# Patient Record
Sex: Male | Born: 1990
Health system: Western US, Academic
[De-identification: ages and names within clinical notes are randomized; demographics above are authoritative.]

---

## 2021-05-25 ENCOUNTER — Telehealth: Payer: TRICARE (CHAMPUS)

## 2021-05-25 NOTE — Telephone Encounter
PDL Call to Clinic    Reason for Call: Per patient, they are requesting to verify if their fax was received. Bree advised we have not received a fax. Patient stated they will fax it again.     Appointment Related?  []  Yes  [x]  No     If yes;  Date: tbd  Time: tbd    Call warm transferred to PDL: []  Yes  [x]  No    Call Received by Clinic Representative: Bree    If call not answered/not accepted, call received by Patient Services Representative:

## 2021-05-26 NOTE — Telephone Encounter
Reply by: Foye Deer  Our office has not received a fax yet.

## 2021-06-01 ENCOUNTER — Ambulatory Visit: Payer: TRICARE (CHAMPUS)

## 2021-06-01 DIAGNOSIS — M25559 Pain in unspecified hip: Secondary | ICD-10-CM

## 2021-06-03 NOTE — Progress Notes
Berenise Hunton Rodman Key, MD  [facility] [place]  Division of Dermatology      Patient Name: Zachary Parsons  MRN: [id number]  Date of Birth: 1990-10-07  Today's Date: 06/03/2021  Last Visit: Visit date not found    Referring Provider: Referral, Self  Primary Care Provider: Talmage Nap, FNP    Subjective:   History:  Patient is a 30 y.o.-year-old male here for poliosis. New patient.     Location: right face- forehead, eyelid, poliosis aNd right cheek  Duration: First noticed patches 1 year ago. Got worse in January 2022  Symptoms: None  Treatments tried: As below    He says he got covid vaccine in August and then noticed these new patches in October. 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 He saw a dermatologist this last year in [place] and clinically c/w vitligo. Was started on Tacrolimus and excimer.Still using tacrolimus twice daily. Tried Excimer laser for about 1 month.  Stopped excimer because he got burned. Has not noticed much of a different with the tacrolimus. He notices that since January right eye has been twitching but denies any change in vision or hearing. Has not seen an ophthalmologist.     PMH: None  Meds: started on Lexapro in June 2022     Denies a personal or family history of skin cancer.    ROS: Otherwise feeling well. No other skin complaints.     Past Medical History:  No past medical history on file.    Social History:       Outpatient Medications:  No current outpatient medications on file.     No current facility-administered medications for this visit.       Allergies:  Not on File    Objective/ Assessment/ Plan:     Physical Examination:  Patient was offered a chaperone, but declined.     General: Pleasant, in no acute distress, appears stated age  Psychiatric: Mood and affect are within normal limits    Skin type: III    Limited exam of the face today  Pertinent skin findings as below:   - Depigmented patches on the right face (forehead, upper eyelid and right cheek)  - Poliosis of the right upper eyelashes. 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 Now stabilized per patient  Counseling/ Work-up:  - Discussed autoimmune nature with patient and that vitiligo can be triggered by immunotherapy such as with BRAF inhibitors or PD-1 inhibitors. In the latter setting, it is considered a good prognostic sign. Rarely, vitiligo may be associated with uveitis.  - Discussed that while the majority of vitiligo patients are otherwise healthy, an association with autoimmune thyroid dysfunction (hyperthyroidism or hypothyroidism) has been demonstrated. In new onset vitiligo patients with systemic symptoms, thyroid screening with antithyroid peroxidase antibody and a serum thyrotropin is recommended (anti-TPO and TSH today) Additional associations include endocrinopathies, such as diabetes mellitus and Addison disease, along with other autoimmune processes- can consider ANA ) Rarely, it may exist as part of polyglandular autoimmune syndrome, particularly a type III syndrome (eg, Hashimoto thyroiditis, vitiligo, or alopecia areata and/or another organ-specific autoimmune disease).  - f/u CBC, TSH, anti-TPO    Treatment:  -Start MVN, ginkgo, heliocare, alpha lipoic acid  -  as pt has segmental vitiligo that has now stablized. NO e/o of active vitligo (I.e. confetti like depigmentation, trichrome) on examination today. Will hold off from pulse dexamethasone.   - START excimer 2-3x/ week  - START Opzelura cream to affected area. FDA approved for repigmentation in patients aged 12 years and older.(FAILED TOPICAL TACROLIMUS. Given peri-ocular involvement, not a good candidate for topical steroids )  - In [place]:   - Topical Bimatoprost  - Surgical Therapy (Superficial autologous skin grafts)    #Tinea Faciale, neck  - START Terbinafine cream to affected area.    ---    The above plan of care, diagnosis, order, and follow-up were discussed with the patient.  Questions related to this recommended plan of care were answered.      RTC 2-3 months with me and next available with Dr. Carolan Clines Grimes/ sooner if worsening    Ertha Nabor Rodman Key, MD ~ Health Sciences Clinical Instructor  Division of Dermatology. [address] of [place], [place]

## 2021-06-06 ENCOUNTER — Ambulatory Visit: Payer: TRICARE (CHAMPUS) | Attending: Student in an Organized Health Care Education/Training Program

## 2021-06-06 DIAGNOSIS — B358 Other dermatophytoses: Secondary | ICD-10-CM

## 2021-06-06 MED ORDER — TERBINAFINE HCL 1 % EX CREA
Freq: Two times a day (BID) | TOPICAL | 5 refills | 20.00000 days | Status: AC
Start: 2021-06-06 — End: ?

## 2021-06-06 MED ORDER — OPZELURA 1.5 % EX CREA
g refills | Status: AC
Start: 2021-06-06 — End: 2021-06-09

## 2021-06-06 NOTE — Patient Instructions
Dr. Marlana Latus.    Please start a multivitamin, gingko, heliocare and lipoic acid.    Vitiligo    What is Vitiligo?  Vitiligo is a skin condition in which your body?s immune system attacks its own pigment-producing cells (melanocytes) in your skin and sometimes in your hair. Sometimes it just affects a localized area of the body but it can also affect many body parts in a generalized manner.      Can I pass it onto someone else?  Vitiligo is not contagious. It can run in families, so your family members may be slightly more likely to have vitiligo.    Is there a cure?   Although there is no cure, with watchful waiting or treatment, you may have re-pigmentation.     Possible treatments, depending on disease severity  Topical Steroids: Topical steroids are very safe and painless and usually the first treatment used   Topical JAK-inhibitors: This is the newest FDA approved treatment for vitiligo. Please let us know if it is not covered by your insurance.  Topical Calcineurin Inhibitors (e.g. Protopic 0.1% oint, Elidel 1% cream): Topical steroid-sparing medications that can be used alone or in combination with topical steroids  Light or Photo Therapy: Narrowband UVB treatment can be used especially in patients with more generalized pigment loss  Oral Steroids: This is sometimes necessary if your vitiligo is actively   Various Vitamins:  HelioCare: This can be purchased at all major drugstores or [employer]  Recommended brands for Gingko biloba  - Herbal Factors (product of Natural Factors)   - Good 'N Natural   - Nature's Bounty   - Nature Made   - NutriGold   - PhysioLogics   - Sisu   - Solgar   - Sundown Naturals   Recommended brands for Alpha lipoic acid  - Good 'N Natural   - Nature's Bounty   - Natural Factors   - Nature Made   - PhysioLogics   - Sisu   - Solgar   - Sundown naturals     What else can I use to camouflage the light areas of my skin?   There are excellent makeup products that can be used to cover up lighter areas of the skin. Thicker foundation and concealer can be bought at [employer] and grocery stores. More expensive brands such as Dermablend can be bought online or at select department stores such as [employer] or [employer] and may last for longer periods of time on your skin.     Where can I get more support?  Losing the color in your skin can be emotionally difficult. There are two foundations that can be helpful resources:  [employer]  http://www.mynvfi.org   The American Vitiligo Research Foundation  ContactDrugs.es        MyChart messages should be reserved for non-urgent and BRIEF questions relevant to issues covered during your visit. For urgent messages, please call. For issues not discussed during your visit, please make an appointment. These messages are only received during business hours.      For prescription refills, please check with your pharmacy for available refills prior to messaging

## 2021-06-08 ENCOUNTER — Telehealth: Payer: TRICARE (CHAMPUS)

## 2021-06-08 ENCOUNTER — Ambulatory Visit: Payer: TRICARE (CHAMPUS) | Attending: Sports Medicine

## 2021-06-08 DIAGNOSIS — M25551 Pain in right hip: Secondary | ICD-10-CM

## 2021-06-08 NOTE — Telephone Encounter
Refill Request    Verified patient was seen within the last 6 months. If not seen within 6 months, offered appointment.     Collected prescription information from patient.     Pended orders for provider to review and sign.     Pharmacy location was confirmed and class was set for pended orders.    Patient or caller has been notified of the turnaround time of 1-2 business day(s).     Pharmacy said they did not receive prescription. Please resubmit

## 2021-06-08 NOTE — Consults
PATIENT: Zachary Parsons  MRN: [id number]  DOB: 10-11-1990  DATE OF SERVICE: 06/08/2021  SERVICE:  Orthopaedic Surgery  ATTENDING: Dr. Norville Haggard  REASON FOR EVALUATION: Right hip pain    Subjective:         REFERRING PHYSICIAN: Talmage Nap, FNP    CHIEF COMPLAINT:    Right hip pain      HPI:    Zachary Parsons is a 30 y.o. male who presents with right hip pain for the past 1+ years. No inciting injury. 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 Always there and possibly worse.   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  At times almost feels like it stuck and may/may not pop. Some relief with pop. Left knee was hurting at some point and was told it may be compensatory.   No radiation of pain. Denies numbing/tingling in the affected extremity.   Physical therapy still for hip and may have helped when Left knee was hurting. No relief from hip pain.     Mechanism of injury:  {fall,blow:16017}, and related to: {cause:15962}.    Other symptoms:  Snap or Pop:  [] Yes   [] No  Swelling: [] Yes   [] No  Instability: [] Yes   [] No  Catching: [] Yes   [] No  Locking: [] Yes   [] No  Pain: [] Yes   [] No      Where:   What activities:  Do symptoms interfere with activities of daily living? [] Yes   [] No    Prior Treatments    NSAIDs:  [] Improved    [] No Benefit   [] Did not Try  Ice   [] Improved    [] No Benefit   [] Did not Try  Heat  [] Improved    [] No Benefit   [] Did not Try  Brace  [] Improved    [] No Benefit   [] Did not Try  Physician   directed PT [] Improved    [] No Benefit   [] Did not Try    How long?    No past medical history on file.    No past surgical history on file.    Current Outpatient Medications   Medication Sig   ? Ruxolitinib Phosphate (OPZELURA) 1.5 % CREA Apply twice a day to affected area on face.   ? terbinafine 1% cream Apply topically two (2) times daily APPLY 2-3 TIMES DAILY TO AFFECTED AREA(S).     No current facility-administered medications for this visit.        Allergies:  No Known Allergies      Social History     Tobacco Use   ? Smoking status: Never Smoker   ? Smokeless tobacco: Never Used   Substance Use Topics   ? Alcohol use: Yes     Comment: Rare       No family history on file.    Review of Systems:  {Ros - complete:21228}         PHYSICAL EXAMINATION    There were no vitals filed for this visit.  There were no vitals filed for this visit.  General: Well appearing, NAD    Head and Neck:   [] NCAT  [] Supple,nontender   ROM:   [] Normal   [] Abnormal      Extremities:    Neuro: SILT s/s/sp/dp/t distributions, +EHL/FHL/TA/GS bilaterally  Vascular: 2+ dp/pt  pulses, warm and well perfused bilaterally    ROM:    Right Lower Extremity Right LE Strength Left Lower Extremity Left LE Strength   Hip Flexion 95 5/5 100 5/5   Hip Extension       External Rotation 30  30    Internal Rotation 10  15    Abduction  5/5  5/5   Addution  5/5  5/5     Leg lengths:  [] Same  [x] Different  Ober test: [x] Positive  [] Negative  Impingement test:  [x] Positive, + FADIR, +FABER on Right  [] Negative  Hip flexor tightness: [x] Yes  [] No      Tenderness:  [] Greater trochanter  [x] Groin  [] AIIS  [] ASIS  [] Pubic Symphesis  [] Ischial Tuberosity    Functional:  VMO atrophy [] Yes   [] No   Alignment: [] Varus  [] Valgus  Gait:  [x] Normal  [] Antalgic  [] Thrust  Coordination: [x] Normal  [] Abnormal     Capillary refill:    [] Normal   [] Abnormal    Sensation: Light Touch: [] Intact   [] Diminished    Spine, Ribs and Pelvis: [] Normal   [] Abnormal    Lymph Nodes: [] Normal   [] Abnormal    Reflexes:   Patellar Tendon:   R  [] Normal   [] Abnormal        L  [] Normal   [] Abnormal   Ankle Jerk:  R  [] Normal   [] Abnormal        L  [] Normal   [] Abnormal     Pulses:  Dorsalis Pedis:   [] 0      Posterior Tibial:  [] 0          [] 1+       [] 1+         [] 2+      [] 2+  Psychologic:   Oriented to time, place and person:  [] Yes   [] No   Mood and affect:       Skin:   Head and Neck: [] Normal   [] Abnormal    Trunk: [] Normal   [] Abnormal    R Upper Extremity: [] Normal   [] Abnormal    L Upper Extremity: [] Normal   [] Abnormal    R Lower Extremity: [] Normal   [] Abnormal    L Lower Extremity: [] Normal   [] Abnormal     Diagnosis of FAI:  FAI on imaging?          Alpha angle >50 (CAM)?           Pincer lesion?     Acetabular retroversion ?          Pistol grip deformity?    Severe symptoms of FAI:  Positive impingement sign:  Failure to respond to  Activity modification?   Medication?      Physical therapy?  53yr or older:  Absence of OA?  Joint space narrowing?  Generalized ligamentous laxity/connective tissue disease?  Osteogenesis imprefecta?      Radiographs:  Tonnis grade: [] 0 None   [] 1 Mild     [] 2 Moderate    [] 3 Severe  Alpha angle:  CEA 36/38  MRI:    No results found.    Assessment:      Zachary Parsons is a 30 y.o. male with a ***       Plan/ Recommendation:

## 2021-06-09 MED ORDER — OPZELURA 1.5 % EX CREA
g refills | Status: AC
Start: 2021-06-09 — End: 2021-06-15

## 2021-06-14 ENCOUNTER — Telehealth: Payer: TRICARE (CHAMPUS)

## 2021-06-14 MED ORDER — OPZELURA 1.5 % EX CREA
g refills | Status: AC
Start: 2021-06-14 — End: ?

## 2021-06-14 NOTE — Telephone Encounter
Call Back Request      Reason for call back: Pt would like to know if his  Insurance benefits have been verified/ approved for phototherapy.    Any Symptoms:  []  Yes  [x]  No       If yes, what symptoms are you experiencing:    o Duration of symptoms (how long):    o Have you taken medication for symptoms (OTC or Rx):      If call was taken outside of clinic hours:    [] Patient or caller has been notified that this message was sent outside of normal clinic hours.     [] Patient or caller has been warm transferred to the physician's answering service. If applicable, patient or caller informed to please call back if symptoms progress.  Patient or caller has been notified of the turnaround time of 1-2 business day(s).

## 2021-06-14 NOTE — Telephone Encounter
Sorry about that. Order has been signed.     Reply by: Darleen Crocker. Lovena Le

## 2021-06-14 NOTE — Telephone Encounter
Message to Practice/Provider      Message: Pt order for Ruxolitinib Phosphate (OPZELURA) 1.5 % CREA has not yet been sent over to [facility] pharmacy pt is requesting for order to be sent as soon as possible.        Return call is not being requested by the patient or caller.    Patient or caller has been notified of the turnaround time of 1-2 business day(s).

## 2021-06-14 NOTE — Telephone Encounter
Dr. Lovena Le,    Transmission did not go through. Please sign order that has been pended.

## 2021-07-07 ENCOUNTER — Ambulatory Visit: Payer: TRICARE (CHAMPUS)

## 2021-07-07 ENCOUNTER — Telehealth: Payer: TRICARE (CHAMPUS)

## 2021-07-08 ENCOUNTER — Telehealth: Payer: TRICARE (CHAMPUS)

## 2021-07-08 ENCOUNTER — Ambulatory Visit: Payer: TRICARE (CHAMPUS)

## 2021-07-09 NOTE — Telephone Encounter
PDL Call to Clinic    Reason for Call:  Patient returning call from office     Appointment Related?  []  Yes  [x]  No     If yes;  Date:  Time:    Call warm transferred to PDL: [x]  Yes  []  No    Call Received by Clinic Representative:      If call not answered/not accepted, call received by Patient Services Representative:

## 2021-07-12 ENCOUNTER — Ambulatory Visit: Payer: TRICARE (CHAMPUS)

## 2021-07-12 ENCOUNTER — Telehealth: Payer: TRICARE (CHAMPUS)

## 2021-07-12 DIAGNOSIS — L80 Vitiligo: Secondary | ICD-10-CM

## 2021-07-12 NOTE — Telephone Encounter
Called [employer] to update auth sent on 10/17 to be changed to CPT 96910. New auth should come in 2-5 business days

## 2021-07-22 NOTE — Telephone Encounter
Call Back Request      Reason for call back: Pt calling to follow up on Auth for his medication, please contact to assist.    Pt stated he is still unable to get medication.      Thank you    Any Symptoms:  []  Yes  [x]  No       If yes, what symptoms are you experiencing:    o Duration of symptoms (how long):    o Have you taken medication for symptoms (OTC or Rx):      If call was taken outside of clinic hours:    [] Patient or caller has been notified that this message was sent outside of normal clinic hours.     [] Patient or caller has been warm transferred to the physician's answering service. If applicable, patient or caller informed to please call back if symptoms progress.  Patient or caller has been notified of the turnaround time of 1 business day(s).

## 2021-08-03 ENCOUNTER — Ambulatory Visit: Payer: TRICARE (CHAMPUS) | Attending: Student in an Organized Health Care Education/Training Program

## 2021-08-03 DIAGNOSIS — L80 Vitiligo: Secondary | ICD-10-CM

## 2021-08-03 NOTE — Progress Notes
PATIENT: Zachary Parsons  MRN: [id number]  DOB: [birth year]  DATE OF SERVICE: 08/03/2021    REFERRING PRACTITIONER: Richarda Blade., MD  PRIMARY CARE PROVIDER: Talmage Nap, FNP  CHIEF COMPLAINT:   Chief Complaint   Patient presents with   ? 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     LV 06/06/21 for segmental vitiligo with Dr. Lovena Le, temporally related to COVID vaccine. Checked CBC, TSH, anti-TPO antibodies. Recommended starting MVN, gingko, heliocare and alpha lipoic acid, recommended excimer 2-3x per week and opzelura cream daily. Tinea faciale rx'd terbinafine cream.    Interval:   - Patient reports that he has started supplements  - Has not been able to get the Opzelura cream  - Has not started Excimer due to approval issues  - Still using tacrolimus ointment mostly every day, has been on since April     Prior history:   - Patient reports he had the COVID vaccine in August/September of 2021, then in October started noticing white patches on the R side of his face. Deployed to [place] in October [place], then got COVID in January 2022 and the vitiligo worsened quickly. Since the vitiligo started he has also noticed twitching in his R eyelid    Review of systems:  Constitional: Negative  Skin: Otherwise negative    PMH/Family Hx:  Past Medical History:   Diagnosis Date   ? PTSD (post-traumatic stress disorder)      No family history on file.  Reviewed    Social History:  Reviewed    Meds:  Current Outpatient Medications   Medication Sig   ? escitalopram 5 mg tablet Take 2.5 mg by mouth.   ? Ruxolitinib Phosphate (OPZELURA) 1.5 % CREA Apply twice a day to affected area on face.   ? tacrolimus 0.03% ointment Apply topically.   ? terbinafine 1% cream Apply topically two (2) times daily APPLY 2-3 TIMES DAILY TO AFFECTED AREA(S).     No current facility-administered medications for this visit.       Allergies:  No Known Allergies ?  OBJECTIVE:    General: NAD, cooperative  Psych: normal mood and affect  Skin exam performed as listed below.  Scalp: examined  Head/face: examined  Eyes (lids/conjunctiva): examined  Lips: examined  Neck: examined    Exam notable for:  - Skin phototype II  - R face segmental depigmented patches in the V1, V2 distribution.     ASSESSMENT/PLAN:    1. Vitiligo  Segmental vitiligo of the R face in the V1/V2 distribution. Interestingly, a recent paper from September 2022 (1) demonstrated positive detection of VZV antigen was statistically associated in the epidermis with recent segmental vitiligo and in the dermis with long-lasting segmental vitiligo. Patient also reported issues with eye twitching and ''nerve issues'' since his vitiligo erupted.   - Will trial both nbUVB Phototherapy 3x weekly; Standing. Consent signed in clinic today, discussed potential risk of skin cancer, sunburn, and reactivation of HSV.   - valACYclovir 500 mg tablet; Take 1 tablet (500 mg total) by mouth two (2) times daily for 3 days.  Dispense: 60 tablet; Refill: 0, would plan to taper following this 1 month course.   - Referral to Ophthalmology  - Continue tacrolimus 0.1% ointment twice daily to face.     1. April Holding, Lepreux S, Cario-Andre M, Rambert J, Dakdaki A, [employer], Abouqal  R, Benzekri L. Varicella-zoster virus in actively spreading segmental vitiligo skin: Pathological, immunochemical, and ultrastructural findings (a first and preliminary study). Pigment Cell Melanoma Res. 2022 Sep 16. doi: 10.1111/pcmr.16109. Epub ahead of print. PMID: 60454098.      Follow-up:  1 month, or as needed sooner for any new, changing, evolving, or concerning lesions or symptoms.    The above plan of care, diagnosis, orders, and follow-up were discussed with the patient.  Questions related to this recommended plan of care were answered. Seen with attending Dr. Marcelle Overlie and Dr. Latanya Maudlin.    Corie Chiquito, MD  Resident Physician  [facility]  08/03/2021  5:07 PM

## 2021-08-04 MED ORDER — VALACYCLOVIR HCL 500 MG PO TABS
500 mg | ORAL_TABLET | Freq: Two times a day (BID) | ORAL | 0 refills | Status: AC
Start: 2021-08-04 — End: ?

## 2021-08-04 NOTE — Patient Instructions
Plan:   - Start valacyclovir 500 mg twice daily for 1 month, then follow up  - Will plan to start both phototherapy three times per week

## 2021-08-09 ENCOUNTER — Ambulatory Visit: Payer: TRICARE (CHAMPUS) | Attending: Student in an Organized Health Care Education/Training Program

## 2021-08-09 ENCOUNTER — Ambulatory Visit: Payer: TRICARE (CHAMPUS) | Attending: Sports Medicine

## 2021-08-09 DIAGNOSIS — L80 Vitiligo: Secondary | ICD-10-CM

## 2021-08-09 NOTE — Patient Instructions
MyChart messages should be reserved for non-urgent and BRIEF questions relevant to issues covered during your visit. For urgent messages, please call. For issues not discussed during your visit, please make an appointment. These messages are only received during business hours.      For prescription refills, please check with your pharmacy for available refills prior to messaging     In 1 week, call [facility] clinic to schedule at [phone]

## 2021-08-09 NOTE — Progress Notes
Audie Wieser Rodman Key, MD  [facility] [place]  Division of Dermatology      Patient Name: Zachary Parsons  MRN: [id number]  Date of Birth: [birth year]  Today's Date: 08/09/2021  Last Visit: 08/03/2021    Referring Provider: Ritta Slot., MD  Primary Care Provider: Talmage Nap, FNP    Subjective:   History:  Patient is a 30 y.o.-year-old male here for follow-up of segmental vitiligo. LV 08/03/2021 with Dr. Marcelle Overlie Dr. Brooke Pace.     Interval:  - Started on Valtrex 500mg  BID x 71month. Pt started this 2 days ago and has not noticed any difference yet  - Referred to ophthalmology. Needs to get a referral from his PCP. Seeing PCP relatively soon and will ask for this referral  - Continued on Tacrolimus ointment. Pt had planned NbUVB 3x/week, but he wasn't able to do it because order was for excimer not NbUVB. Pt requesting new orders today so that he can start light therapy. Previously managed on excimer through the [place].  - Tinea faciei resolved with terbinafine cream    Per HPI:  He says he got covid vaccine in August and then noticed these new patches in October. 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 He saw a dermatologist this last year in [place] and clinically c/w vitligo. Was started on Tacrolimus and excimer.Still using tacrolimus twice daily. Tried Excimer laser for about 1 month.  Stopped excimer because he got burned. Has not noticed much of a different with the tacrolimus. He notices that since January right eye has been twitching but denies any change in vision or hearing. Has not seen an ophthalmologist.   ROS: Otherwise feeling well. No other skin complaints.     Past Medical History:  Past Medical History:   Diagnosis Date   ? PTSD (post-traumatic stress disorder)        Social History:  Social History     Tobacco Use   ? Smoking status: Never   ? Smokeless tobacco: Never   Substance and Sexual Activity   ? Alcohol use: Yes     Comment: Rare       Outpatient Medications:  Current Outpatient Medications   Medication Sig   ? escitalopram 5 mg tablet Take 2.5 mg by mouth.   ? Ruxolitinib Phosphate (OPZELURA) 1.5 % CREA Apply twice a day to affected area on face.   ? tacrolimus 0.03% ointment Apply topically.   ? terbinafine 1% cream Apply topically two (2) times daily APPLY 2-3 TIMES DAILY TO AFFECTED AREA(S).   ? [EXPIRED] valACYclovir 500 mg tablet Take 1 tablet (500 mg total) by mouth two (2) times daily for 3 days.     No current facility-administered medications for this visit.       Allergies:  No Known Allergies    Objective/ Assessment/ Plan:     Physical Examination:  Patient was offered a chaperone, but declined.     General: Pleasant, in no acute distress, appears stated age  Psychiatric: Mood and affect are within normal limits    Skin type: 1    Pertinent skin findings as below:   - Depigmented patches on the right face (forehead, upper eyelid and right cheek) in V1/V2 distribution  - Poliosis of the right upper eyelashes. (also involvement of the right eyebrow but dyed today)    Pertinent Lab/Work-up:  05/2021: CBC, TSH, anti-TPO antibodies negative    Objective/ Assessment/ Plan:     #Segmental Vitiligo,  BSA ~5%. Localized to right face- following COVID Vaccine.   Segmental vitiligo of the R face in the V1/V2 distribution. Interestingly, a recent paper from September 2022 (1) demonstrated positive detection of VZV antigen was statistically associated in the epidermis with recent segmental vitiligo and in the dermis with long-lasting segmental vitiligo. Patient also reported issues with eye twitching and ''nerve issues'' since his vitiligo erupted  Counseling/ Work-up:  - Discussed autoimmune nature with patient and that vitiligo can be triggered by immunotherapy such as with BRAF inhibitors or PD-1 inhibitors. In the latter setting, it is considered a good prognostic sign. Rarely, vitiligo may be associated with uveitis.  - F/u with ophthalmology ?  Treatment:  - Continue Valacylovir 500mg  BID x1 month per Dr. Gay Filler and Latanya Maudlin. Plans to RTC with Vitiligo clinic in 1 month  -Continue MVN, ginkgo, heliocare, alpha lipoic acid  - START NbUVB 2-3x/ week- consent signed at last visit. Reordered today  - Continue Tacrolimus 0.1% ointment BID to affected area.     #Tinea Faciale, neck  - resolved.     Abdelmaksoud A, Juanda Crumble as a potential therapy for mycosis fungoides and vitiligo in a zosteriform pattern. Dermatol Ther. 2019 May;32(3):e12870. doi: 10.1111/dth.12870. Epub 2019 Mar 22. PMID: 04540981.     April Holding, Lepreux S, Cario-Andre M, Rambert J, Dakdaki A, Lafon ME, Abouqal R, Benzekri L. Varicella-zoster virus in actively spreading segmental vitiligo skin: Pathological, immunochemical, and ultrastructural findings (a first and preliminary study). Pigment Cell Melanoma Res. 2022 Sep 16. doi: 10.1111/pcmr.19147. Epub ahead of print. PMID: 82956213.  ?  ---    The above plan of care, diagnosis, order, and follow-up were discussed with the patient.  Questions related to this recommended plan of care were answered.      RTC 1 month with Dr. Latanya Maudlin and 2-3 months with me / sooner if worsening    Chen Holzman Rodman Key, MD ~ [employer] Clinical Instructor  Division of Dermatology. [address] of [place], [place]

## 2021-08-09 NOTE — Progress Notes
HI Kandis Mannan can you please assist with approval for nbUVB booth phototherapy 3x per week for this patient? I believe he had previously been approved for Excimer, but we would like to try booth. Many thanks for your help!

## 2021-08-10 ENCOUNTER — Inpatient Hospital Stay: Payer: TRICARE (CHAMPUS) | Attending: Sports Medicine

## 2021-08-10 DIAGNOSIS — M25551 Pain in right hip: Secondary | ICD-10-CM

## 2021-08-10 MED ADMIN — ROPIVACAINE HCL 5 MG/ML IJ SOLN: 150 mg | INTRA_ARTICULAR | @ 18:00:00 | Stop: 2021-08-10 | NDC 70069006401

## 2021-08-10 MED ADMIN — IOHEXOL 350 MG/ML IV SOLN: 100 mL | INTRA_ARTICULAR | @ 18:00:00 | Stop: 2021-08-10 | NDC 00407141491

## 2021-08-10 MED ADMIN — SODIUM CHLORIDE 0.9 % (PF) SOLN: 10 mL | INTRA_ARTICULAR | @ 18:00:00 | Stop: 2021-08-10 | NDC 63323018610

## 2021-08-10 MED ADMIN — LIDOCAINE HCL (PF) 1 % IJ SOLN: 5 mL | SUBCUTANEOUS | @ 18:00:00 | Stop: 2021-08-10 | NDC 63323049257

## 2021-08-10 MED ADMIN — GADOBUTROL 1 MMOL/ML IV SOLN: 2 mL | INTRA_ARTICULAR | @ 18:00:00 | Stop: 2021-08-10 | NDC 50419032511

## 2021-08-24 ENCOUNTER — Ambulatory Visit: Payer: TRICARE (CHAMPUS)

## 2021-08-24 ENCOUNTER — Telehealth: Payer: TRICARE (CHAMPUS)

## 2021-08-24 NOTE — Telephone Encounter
Called patient to inform him that his authorization is all done and he can call to set his appointment

## 2021-08-24 NOTE — Progress Notes
I saw and evaluated the patient.  I discussed the case with the participating resident, Emilie Jacobsen, MD and volunteer attending, Pearl Grimes, MD and agree with the findings and plan of care as described above.  I was present for the critical or key portions of the visit and immediately available throughout the visit.

## 2021-08-24 NOTE — Telephone Encounter
Spoke with patient to inform him that his authorization was already approved and he can call to schedule his appointment with phototherapy in [facility]

## 2021-08-24 NOTE — Telephone Encounter
Call Back Request      Reason for call back: Pt has been trying to schedule appt for Phototherapy but authorization has not been approved. The referral has been submitted for a few weeks but still pending. Please contact patient to provide an update or see if there is anything office can do. Thank you!     Any Symptoms:  []  Yes  [x]  No       If yes, what symptoms are you experiencing:    o Duration of symptoms (how long):    o Have you taken medication for symptoms (OTC or Rx):      If call was taken outside of clinic hours:    [] Patient or caller has been notified that this message was sent outside of normal clinic hours.     [] Patient or caller has been warm transferred to the physician's answering service. If applicable, patient or caller informed to please call back if symptoms progress.  Patient or caller has been notified of the turnaround time of 1-2 business day(s).

## 2021-08-25 ENCOUNTER — Ambulatory Visit: Payer: TRICARE (CHAMPUS)

## 2021-08-25 DIAGNOSIS — L80 Vitiligo: Secondary | ICD-10-CM

## 2021-08-25 NOTE — Interdisciplinary
Pt in for initial visit. Treatment explained and side effects reviewed.    Mineral Oil was provided and applied to affected areas of the body    Last Dermatology Doctor Visit: 08/09/2021  Patient to follow up 11/09/2021    Patient advised to contact office if any concerns to today's treatment.  Office number:  [phone]

## 2021-08-26 ENCOUNTER — Ambulatory Visit: Payer: TRICARE (CHAMPUS)

## 2021-08-29 ENCOUNTER — Ambulatory Visit: Payer: TRICARE (CHAMPUS)

## 2021-08-29 DIAGNOSIS — L80 Vitiligo: Secondary | ICD-10-CM

## 2021-08-29 NOTE — Interdisciplinary
Pt in for initial visit. Treatment explained and side effects reviewed.    Mineral Oil was provided and applied to affected areas of the body    Last Dermatology Doctor Visit: 08/09/2021  Patient to follow up 11/09/2021    Patient advised to contact office if any concerns to today's treatment.  Office number:  [phone]

## 2021-08-31 ENCOUNTER — Ambulatory Visit: Payer: TRICARE (CHAMPUS)

## 2021-09-02 ENCOUNTER — Ambulatory Visit: Payer: TRICARE (CHAMPUS)

## 2021-09-02 DIAGNOSIS — L80 Vitiligo: Secondary | ICD-10-CM

## 2021-09-02 NOTE — Interdisciplinary
Patient states tolerated treatment from last visit  well,  Patient states had no burning or reaction.    Mineral Oil was provided and applied to affected areas of the body    Last Dermatology Doctor Visit: 08/09/21  Patient to follow up 11/09/21    Patient advised to contact office if any concerns to today's treatment.  Office number:  [phone]

## 2021-09-06 ENCOUNTER — Ambulatory Visit: Payer: TRICARE (CHAMPUS)

## 2021-09-06 DIAGNOSIS — L80 Vitiligo: Secondary | ICD-10-CM

## 2021-09-06 NOTE — Interdisciplinary
Patient states tolerated treatment from last visitwell, Patient states had no burning or reaction.    Mineral Oil was provided and applied to affected areas of the body    Last Dermatology Doctor Visit:08/09/21  Patient to follow up3/22/23    Patient advised to contact office if any concerns to today's treatment. Office number: [phone]

## 2021-09-07 ENCOUNTER — Ambulatory Visit: Payer: TRICARE (CHAMPUS) | Attending: Sports Medicine

## 2021-09-07 DIAGNOSIS — M25851 Other specified joint disorders, right hip: Secondary | ICD-10-CM

## 2021-09-07 DIAGNOSIS — M25551 Pain in right hip: Secondary | ICD-10-CM

## 2021-09-07 NOTE — Progress Notes
PATIENT: Zachary Parsons  MRN: [id number]  DOB: 1991/06/20  DATE OF SERVICE: 06/08/21  SERVICE:  Orthopaedic Surgery  ATTENDING: Dr. Norville Haggard  REASON FOR EVALUATION: Right hip pain    Subjective:         REFERRING PHYSICIAN: Talmage Nap, FNP    CHIEF COMPLAINT:    Right hip pain    HPI:    Zachary Parsons is a 31 y.o. male who presents with right hip pain for the past 1+ years. No inciting injury. Started summer 2021 and attributes to activities related to [employer]. 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 This helped for about a month, however, pain is worse since the injection.     Currently, pain is located at groin aspect of right hip. Pain is constant.  No radiation of pain. At times almost feels like hip gets stuck and may/may not pop. Some relief with pop. Left knee was hurting at some point and was told it may be compensatory. Denies numbing/tingling in the affected extremity.  He is still in physical therapy still for hip and may have helped when Left knee was hurting but feels that he has reached plateau.      Mechanism of injury:  none, and related to:    Interval History 09/07/21:   The patient was last seen in clinic 06/08/22. Since his last visit     Other symptoms:  Snap or Pop:  [x] Yes   [] No  Swelling: [] Yes   [x] No  Instability: [x] Yes   [] No  Catching: [] Yes   [x] No  Locking: [x] Yes   [] No  Pain: [x] Yes   [] No      Where: Right groin    What activities: Constant   Do symptoms interfere with activities of daily living? [x] Yes   [] No    Prior Treatments    NSAIDs:  [] Improved    [x] No Benefit   [] Did not Try  Ice   [] Improved    [] No Benefit   [x] Did not Try  Heat  [] Improved    [] No Benefit   [x] Did not Try  Brace  [] Improved    [] No Benefit   [x] Did not Try  Physician   directed PT [x] Improved    [] No Benefit   [] Did not Try    How long? Months    Past Medical History:   Diagnosis Date   ? PTSD (post-traumatic stress disorder) Past Surgical History:   Procedure Laterality Date   ? INGUINAL HERNIA REPAIR Left    ? KNEE ARTHROSCOPY     ? SHOULDER ARTHROSCOPY     ? SHOULDER SURGERY         Current Outpatient Medications   Medication Sig   ? escitalopram 5 mg tablet Take 2.5 mg by mouth.   ? Ruxolitinib Phosphate (OPZELURA) 1.5 % CREA Apply twice a day to affected area on face.   ? tacrolimus 0.03% ointment Apply topically.   ? terbinafine 1% cream Apply topically two (2) times daily APPLY 2-3 TIMES DAILY TO AFFECTED AREA(S).     No current facility-administered medications for this visit.        Allergies:  No Known Allergies      Social History     Tobacco Use   ? Smoking status: Never   ? Smokeless tobacco: Never   Substance Use Topics   ? Alcohol use: Yes     Comment: Rare  No family history on file.    Review of Systems:  A 14-system review of systems was performed and is negative except as stated in the history of present illness.       PHYSICAL EXAMINATION  Vitals:   There were no vitals filed for this visit.  General: Well appearing, NAD    Head and Neck:   [x] NCAT  [] Supple,nontender   ROM:   [x] Normal   [] Abnormal      Extremities:    Neuro: SILT s/s/sp/dp/t distributions, +EHL/FHL/TA/GS bilaterally  Vascular: 2+ dp/pt pulses, warm and well perfused bilaterally    ROM:    Right Lower Extremity Right LE Strength Left Lower Extremity Left LE Strength   Hip Flexion 95 5/5 100 5/5   Hip Extension       External Rotation 30  30    Internal Rotation 10  15    Abduction  5/5  5/5   Adduction  5/5  5/5     Leg lengths:  [] Same  [x] Different  Ober test: [x] Positive  [] Negative  Impingement test:  [x] Positive, + FADIR, +FABER on Right  [] Negative  Hip flexor tightness: [x] Yes  [] No      Tenderness:  [] Greater trochanter  [x] Groin  [] AIIS  [] ASIS  [] Pubic Symphesis  [] Ischial Tuberosity    Functional:  VMO atrophy [] Yes   [x] No   Alignment: [x] Varus  [] Valgus  Gait:  [x] Normal  [] Antalgic  [] Thrust  Coordination: [x] Normal [] Abnormal     Capillary refill:    [x] Normal   [] Abnormal    Sensation: Light Touch: [x] Intact   [] Diminished    Spine, Ribs and Pelvis: [x] Normal   [] Abnormal    Lymph Nodes: [x] Normal   [] Abnormal    Reflexes:   Patellar Tendon:   R  [x] Normal   [] Abnormal        L  [x] Normal   [] Abnormal   Ankle Jerk:  R  [] Normal   [] Abnormal        L  [] Normal   [] Abnormal     Pulses:  Dorsalis Pedis:   [] 0      Posterior Tibial:  [] 0          [] 1+       [] 1+         [x] 2+      [x] 2+  Psychologic:   Oriented to time, place and person:  [x] Yes   [] No   Mood and affect:  Appropriate     Skin:   Head and Neck: [x] Normal   [] Abnormal    Trunk: [] Normal   [] Abnormal    R Upper Extremity: [] Normal   [] Abnormal    L Upper Extremity: [] Normal   [] Abnormal    R Lower Extremity: [x] Normal   [] Abnormal    L Lower Extremity: [x] Normal   [] Abnormal     Diagnosis of FAI:  FAI on imaging?  Yes        Alpha angle >50 (CAM)?           Pincer lesion? No    Acetabular retroversion ?  N     Pistol grip deformity?  Y  Severe symptoms of FAI: Y  Positive impingement sign: Y  Failure to respond to  Activity modification? Y  Medication? Y     Physical therapy? Y  49yr or older: Y  Absence of OA?   Joint space narrowing?  Generalized ligamentous laxity/connective tissue disease?  Osteogenesis imprefecta?      Radiographs:  Tonnis grade: [x] 0  None   [] 1 Mild     [] 2 Moderate    [] 3 Severe  Alpha angle: Unable to measure  CEA 36/38    Xray Right hip/Pelvis:   No acute fractures or dislocations. Bilateral hip pistol grip deformity.     MR Arthrogram Right Hip:  MR HIP ARTHROGRAM W INTRA-ARTICULAR CONTRAST RIGHT, FL HIP INJ FOR CT OR MRI ARTHROGRAM RIGHT   ?  INDICATION: 31 year old male, clinical concern for hip impingement, labral tear  ?  COMPARISON: Correlation with hip radiograph dated 06/08/2021  ?  TECHNIQUE:   ?  The benefits, risks and alternatives to the procedure were discussed with the patient. The risks included infection, bleeding or injury to neighboring nerves or vessels. Following informed consent the patient was placed on the fluoroscopy table in a supine position. The skin surface overlying the right right hip, at the lateral femoral head-neck junction, was marked with fluoroscopic guidance. The area of interest was then prepped and draped in usual sterile fashion. The skin and subcutaneous tissues were anesthetized with 1% lidocaine. The hip joint was then accessed with a 3-1/2 inches, 22 gauge spinal needle. The intra-articular position of the needle was confirmed with a test injection of iodinated contrast. This was followed by injection of approximately 10 cc of a dilute gadolinium solution (10 cc normal saline, 5 cc iodinated contrast, 5 cc 0.5% ropivacaine and 1. Cc MultiHance for a 1:200 dilution).   ?  The needle was removed, hemostasis was achieved, and a sterile bandage was applied.  ?  There were no immediate complications. The patient tolerated the procedure well.    ?  Fluoroscopy time was 2.2 seconds.  ?  The patient was then taken to the MRI scanner, where coronal T1 and STIR images of the whole pelvis, and triplanar T1 fat saturated and thin cut axial DESS sequences of the hip were obtained.  ?  FINDINGS:  ?  There is an incompletely characterized focus of T2 hyperintensity in the sacrum without correlate on T1 sequences, but favored to represent a small intraosseous hemangioma. Otherwise, the marrow signal is normal. There is no acute fracture.  ?  Both hips are normally aligned. With respect to the right hip, there is no full-thickness or high-grade cartilage loss. There is a bony protuberance at the superior femoral head-neck junction, compatible with CAM-type morphology. There is a tear of the anterior-superior labrum extending to the anterior labrum, with a full-thickness tear at the anterior labrum through the chondrolabral base (8-11). There is no discrete paralabral cyst. The ligamentum teres and transverse ligament is intact. The iliofemoral and ischiofemoral ligaments are intact.  ?  The sacroiliac joints and pubic symphysis are normal.  ?  The ischiofemoral space is normal.  ?  Imaged gluteal, hamstring, adductor and anterior femoral muscle tendon attachments are intact. There is a small elongated T2 hyperintense lesion in the vastus lateralis muscle measuring 13 x 5 mm (3-16), incompletely characterized but likely benign. There is a similar appearing a lobulated T2 hyperintense lesion at the left gluteal flank, incompletely evaluated on this examination (3-13). Otherwise, muscles are normal in bulk and morphology.  ?  Please note, this examination is not tailored to evaluate the intrapelvic contents. Within this limitation, there is no free fluid in the pelvis.  ?  IMPRESSION:  ?  1.  Tear of the anterior-superior labrum, extending to the anterior labrum and coursing through the chondral labral base. No paralabral cyst.  ?  2.  Findings on  a background of a CAM type morphology at the femoral head-neck junction.  ?  I, [place] S Gross, MD, have reviewed this radiologic study personally and I am in full agreement with the findings presented in this report.   ?  Dictated by: Dema Severin   08/10/2021 4:36 PM  ?  Signed by: [place] S Gross   08/11/2021 8:28 AM    Assessment:      Nova Evett is a 31 y.o. 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     Plan/ Recommendation:

## 2021-09-08 ENCOUNTER — Ambulatory Visit: Payer: TRICARE (CHAMPUS)

## 2021-09-08 DIAGNOSIS — L80 Vitiligo: Secondary | ICD-10-CM

## 2021-09-08 NOTE — Interdisciplinary
Patient states tolerated treatment from last visitwell, Patient states had no burning or reaction.    Mineral Oil was provided and applied to affected areas of the body    Last Dermatology Doctor Visit:08/09/21  Patient to follow up3/22/23    Patient advised to contact office if any concerns to today's treatment. Office number: [phone]

## 2021-09-12 ENCOUNTER — Ambulatory Visit: Payer: TRICARE (CHAMPUS)

## 2021-09-12 DIAGNOSIS — L80 Vitiligo: Secondary | ICD-10-CM

## 2021-09-12 NOTE — Interdisciplinary
Patient states tolerated treatment from last visitwell, Patient states had no burning or reaction.    Mineral Oil was provided and applied to affected areas of the body    Last Dermatology Doctor Visit:08/09/21  Patient to follow up3/22/23    Patient advised to contact office if any concerns to today's treatment. Office number: [phone]

## 2021-09-14 ENCOUNTER — Ambulatory Visit: Payer: TRICARE (CHAMPUS)

## 2021-09-14 DIAGNOSIS — L80 Vitiligo: Secondary | ICD-10-CM

## 2021-09-14 NOTE — Interdisciplinary
Patient states tolerated treatment from last visitwell, Patient states had no burning or reaction.    Mineral Oil was provided and applied to affected areas of the body    Last Dermatology Doctor Visit:08/09/21  Patient to follow up3/22/23    Patient advised to contact office if any concerns to today's treatment. Office number: [phone]

## 2021-09-16 ENCOUNTER — Ambulatory Visit: Payer: TRICARE (CHAMPUS)

## 2021-09-16 DIAGNOSIS — L80 Vitiligo: Secondary | ICD-10-CM

## 2021-09-16 NOTE — Interdisciplinary
Patient states tolerated treatment from last visitwell, Patient states had no burning or reaction.    Mineral Oil was provided and applied to affected areas of the body    Last Dermatology Doctor Visit:08/09/21  Patient to follow up3/22/23    Patient advised to contact office if any concerns to today's treatment. Office number: [phone]

## 2021-09-19 ENCOUNTER — Ambulatory Visit: Payer: TRICARE (CHAMPUS)

## 2021-09-19 DIAGNOSIS — L80 Vitiligo: Secondary | ICD-10-CM

## 2021-09-19 NOTE — Interdisciplinary
Patient states tolerated treatment from last visitwell, Patient states had no burning or reaction.    Mineral Oil was provided and applied to affected areas of the body    Last Dermatology Doctor Visit:08/09/21  Patient to follow up3/22/23    Patient advised to contact office if any concerns to today's treatment. Office number: [phone]

## 2021-09-21 ENCOUNTER — Ambulatory Visit: Payer: TRICARE (CHAMPUS)

## 2021-09-21 DIAGNOSIS — L80 Vitiligo: Secondary | ICD-10-CM

## 2021-09-21 NOTE — Interdisciplinary
Patient states tolerated treatment from last visitwell, Patient states had no burning or reaction.    Mineral Oil was provided and applied to affected areas of the body    Last Dermatology Doctor Visit:08/09/21  Patient to follow up3/22/23    Patient advised to contact office if any concerns to today's treatment. Office number: [phone]

## 2021-09-23 ENCOUNTER — Ambulatory Visit: Payer: TRICARE (CHAMPUS)

## 2021-09-23 DIAGNOSIS — L80 Vitiligo: Secondary | ICD-10-CM

## 2021-09-23 NOTE — Interdisciplinary
Patient states tolerated treatment from last visitwell, Patient states had no burning or reaction.    Last Dermatology Doctor Visit:08/09/21  Patient to follow up3/22/23    Patient advised to contact office if any concerns to today's treatment. Office number: [phone]

## 2021-09-26 ENCOUNTER — Ambulatory Visit: Payer: TRICARE (CHAMPUS)

## 2021-09-26 DIAGNOSIS — L80 Vitiligo: Secondary | ICD-10-CM

## 2021-09-26 NOTE — Interdisciplinary
Patient states tolerated treatment from last visitwell, Patient states had no burning or reaction.    Mineral Oil was provided and applied to affected areas of the body    Last Dermatology Doctor Visit:08/09/21  Patient to follow up3/22/23    Patient advised to contact office if any concerns to today's treatment. Office number: [phone]

## 2021-09-28 ENCOUNTER — Ambulatory Visit: Payer: TRICARE (CHAMPUS)

## 2021-09-28 DIAGNOSIS — L80 Vitiligo: Secondary | ICD-10-CM

## 2021-09-28 NOTE — Interdisciplinary
Patient states tolerated treatment from last visitwell, Patient states had no burning or reaction.    Mineral Oil was provided and applied to affected areas of the body    Last Dermatology Doctor Visit:08/09/21  Patient to follow up3/22/23    Patient advised to contact office if any concerns to today's treatment. Office number: [phone]

## 2021-09-30 ENCOUNTER — Ambulatory Visit: Payer: TRICARE (CHAMPUS)

## 2021-09-30 DIAGNOSIS — L80 Vitiligo: Secondary | ICD-10-CM

## 2021-09-30 NOTE — Interdisciplinary
Patient states tolerated treatment from last visitwell, Patient states had no burning or reaction.    Mineral Oil was provided and applied to affected areas of the body    Last Dermatology Doctor Visit:08/09/21  Patient to follow up3/22/23    Patient advised to contact office if any concerns to today's treatment. Office number: [phone]

## 2021-10-03 ENCOUNTER — Ambulatory Visit: Payer: TRICARE (CHAMPUS)

## 2021-10-03 DIAGNOSIS — L80 Vitiligo: Secondary | ICD-10-CM

## 2021-10-03 NOTE — Interdisciplinary
Patient states tolerated treatment from last visitwell, Patient states had no burning or reaction.    Mineral Oil was provided and applied to affected areas of the body    Last Dermatology Doctor Visit:08/09/21  Patient to follow up3/22/23    Patient advised to contact office if any concerns to today's treatment. Office number: [phone]

## 2021-10-05 ENCOUNTER — Ambulatory Visit: Payer: TRICARE (CHAMPUS)

## 2021-10-05 DIAGNOSIS — L80 Vitiligo: Secondary | ICD-10-CM

## 2021-10-05 NOTE — Interdisciplinary
Patient states tolerated treatment from last visitwell, Patient states had no burning or reaction.    Mineral Oil was provided and applied to affected areas of the body    Last Dermatology Doctor Visit:08/09/21  Patient to follow up3/22/23    Patient advised to contact office if any concerns to today's treatment. Office number: [phone]

## 2021-10-07 ENCOUNTER — Ambulatory Visit: Payer: TRICARE (CHAMPUS)

## 2021-10-07 DIAGNOSIS — L80 Vitiligo: Secondary | ICD-10-CM

## 2021-10-07 NOTE — Interdisciplinary
Patient states tolerated treatment from last visitwell, Patient states had no burning or reaction.    Mineral Oil was provided and applied to affected areas of the body    Last Dermatology Doctor Visit:08/09/21  Patient to follow up3/22/23    Patient advised to contact office if any concerns to today's treatment. Office number: [phone]

## 2021-10-12 ENCOUNTER — Ambulatory Visit: Payer: TRICARE (CHAMPUS)

## 2021-10-12 DIAGNOSIS — L80 Vitiligo: Secondary | ICD-10-CM

## 2021-10-12 NOTE — Interdisciplinary
Patient states tolerated treatment from last visitwell, Patient states had no burning or reaction.    Mineral Oil was provided and applied to affected areas of the body    Last Dermatology Doctor Visit:08/09/21  Patient to follow up3/22/23    Patient advised to contact office if any concerns to today's treatment. Office number: [phone]

## 2021-10-13 ENCOUNTER — Telehealth: Payer: TRICARE (CHAMPUS)

## 2021-10-13 NOTE — Telephone Encounter
Call Back Request      Reason for call back:   Pt needs f/u appt with Dr. Latanya Maudlin  Any Symptoms:  []  Yes  [x]  No       If yes, what symptoms are you experiencing:    o Duration of symptoms (how long):    o Have you taken medication for symptoms (OTC or Rx):      If call was taken outside of clinic hours:    [] Patient or caller has been notified that this message was sent outside of normal clinic hours.     [] Patient or caller has been warm transferred to the physician's answering service. If applicable, patient or caller informed to please call back if symptoms progress.  Patient or caller has been notified of the turnaround time of 1-2 business day(s).

## 2021-10-14 ENCOUNTER — Ambulatory Visit: Payer: TRICARE (CHAMPUS)

## 2021-10-17 ENCOUNTER — Ambulatory Visit: Payer: TRICARE (CHAMPUS)

## 2021-10-17 DIAGNOSIS — L80 Vitiligo: Secondary | ICD-10-CM

## 2021-10-17 NOTE — Interdisciplinary
Patient states tolerated treatment from last visitwell, Patient states had no burning or reaction.    Mineral Oil was provided and applied to affected areas of the body    Last Dermatology Doctor Visit:08/09/21  Patient to follow up3/22/23    Patient advised to contact office if any concerns to today's treatment. Office number: [phone]

## 2021-10-19 ENCOUNTER — Ambulatory Visit: Payer: TRICARE (CHAMPUS)

## 2021-10-19 DIAGNOSIS — L80 Vitiligo: Secondary | ICD-10-CM

## 2021-10-19 NOTE — Interdisciplinary
Patient states tolerated treatment from last visitwell, Patient states had no burning or reaction.    Mineral Oil was provided and applied to affected areas of the body    Last Dermatology Doctor Visit:08/09/21  Patient to follow up3/22/23    Patient advised to contact office if any concerns to today's treatment. Office number: [phone]

## 2021-10-19 NOTE — Telephone Encounter
I left a message for the patient to call back. A follow up visit with Dr. Latanya Maudlin has been scheduled for 12/07/2021, that is the next available.

## 2021-10-21 ENCOUNTER — Ambulatory Visit: Payer: TRICARE (CHAMPUS)

## 2021-10-21 DIAGNOSIS — L80 Vitiligo: Secondary | ICD-10-CM

## 2021-10-21 NOTE — Interdisciplinary
Patient states tolerated treatment from last visitwell, Patient states had no burning or reaction.    Mineral Oil was provided and applied to affected areas of the body    Last Dermatology Doctor Visit:08/09/21  Patient to follow up3/22/23    Patient advised to contact office if any concerns to today's treatment. Office number: [phone]

## 2021-10-25 ENCOUNTER — Ambulatory Visit: Payer: TRICARE (CHAMPUS)

## 2021-10-25 DIAGNOSIS — L80 Vitiligo: Secondary | ICD-10-CM

## 2021-10-25 NOTE — Interdisciplinary
Patient states tolerated treatment from last visitwell, Patient states had no burning or reaction.    Mineral Oil was provided and applied to affected areas of the body    Last Dermatology Doctor Visit:08/09/21  Patient to follow up3/22/23    Patient advised to contact office if any concerns to today's treatment. Office number: [phone]

## 2021-10-27 ENCOUNTER — Ambulatory Visit: Payer: TRICARE (CHAMPUS)

## 2021-10-28 ENCOUNTER — Ambulatory Visit: Payer: TRICARE (CHAMPUS)

## 2021-10-28 DIAGNOSIS — L80 Vitiligo: Secondary | ICD-10-CM

## 2021-10-28 NOTE — Interdisciplinary
Patient states tolerated treatment from last visitwell, Patient states had no burning or reaction.    Last Dermatology Doctor Visit:08/09/21  Patient to follow up3/22/23    Patient advised to contact office if any concerns to today's treatment. Office number: [phone]

## 2021-10-31 ENCOUNTER — Ambulatory Visit: Payer: TRICARE (CHAMPUS)

## 2021-10-31 DIAGNOSIS — L80 Vitiligo: Secondary | ICD-10-CM

## 2021-10-31 NOTE — Interdisciplinary
Patient states tolerated treatment from last visitwell, Patient states had no burning or reaction.    Last Dermatology Doctor Visit:08/09/21  Patient to follow up3/22/23    Patient advised to contact office if any concerns to today's treatment. Office number: [phone]

## 2021-11-02 ENCOUNTER — Ambulatory Visit: Payer: TRICARE (CHAMPUS)

## 2021-11-02 DIAGNOSIS — L80 Vitiligo: Secondary | ICD-10-CM

## 2021-11-02 NOTE — Interdisciplinary
Patient states tolerated treatment from last visitwell, Patient states had no burning or reaction.    Last Dermatology Doctor Visit:08/09/21  Patient to follow up3/22/23    Patient advised to contact office if any concerns to today's treatment. Office number: [phone]

## 2021-11-04 ENCOUNTER — Ambulatory Visit: Payer: TRICARE (CHAMPUS)

## 2021-11-07 ENCOUNTER — Ambulatory Visit: Payer: TRICARE (CHAMPUS)

## 2021-11-07 DIAGNOSIS — L80 Vitiligo: Secondary | ICD-10-CM

## 2021-11-07 NOTE — Interdisciplinary
Patient states tolerated treatment from last visitwell, Patient states had no burning or reaction.    Last Dermatology Doctor Visit:08/09/21  Patient to follow up3/22/23    Patient advised to contact office if any concerns to today's treatment. Office number: [phone]

## 2021-11-08 ENCOUNTER — Telehealth: Payer: TRICARE (CHAMPUS)

## 2021-11-09 ENCOUNTER — Ambulatory Visit: Payer: TRICARE (CHAMPUS) | Attending: Student in an Organized Health Care Education/Training Program

## 2021-11-09 ENCOUNTER — Ambulatory Visit: Payer: TRICARE (CHAMPUS)

## 2021-11-09 DIAGNOSIS — Z872 Personal history of diseases of the skin and subcutaneous tissue: Secondary | ICD-10-CM

## 2021-11-09 DIAGNOSIS — L80 Vitiligo: Secondary | ICD-10-CM

## 2021-11-09 MED ORDER — OPZELURA 1.5 % EX CREA
g refills | Status: AC
Start: 2021-11-09 — End: ?

## 2021-11-09 NOTE — Progress Notes
Zachary Stegenga Rodman Key, MD  [facility] [place]  Division of Dermatology      Patient Name: Zachary Parsons  MRN: [id number]  Date of Birth: 1991-04-17  Today'Parsons Date: 11/09/2021  Last Visit: 08/03/2021    Referring Provider: Ritta Slot., MD  Primary Care Provider: Talmage Nap, FNP    Subjective:   History:  Patient is Parsons 31 y.o.-year-old male here for follow-up of segmental vitiligo. LV 12.20.2023    Interval:  - Doing phototherapy 2-3 times Parsons week. Was not able to start opzelura- not approved. Per patient does have Parsons history of eczematous rashes in the past.   Parsons/p Valtrex 500mg  BID x 35month. Finished 1 month ago. Continued on Tacrolimus ointment.   - Has not been able to see ophthalmology but plans to see next week  - Has not seen much improvement on this therapy and thinks that the periphery is darker    Per HPI:  He says he got covid vaccine in August and then noticed these new patches in October. 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 He saw Parsons dermatologist this last year in [place] and clinically c/w vitligo. Was started on Tacrolimus and excimer.Still using tacrolimus twice daily. Tried Excimer laser for about 1 month.  Stopped excimer because he got burned. Has not noticed much of Parsons different with the tacrolimus. He notices that since January right eye has been twitching but denies any change in vision or hearing. Has not seen an ophthalmologist.   ROS: Otherwise feeling well. No other skin complaints.     Past Medical History:  Past Medical History:   Diagnosis Date   ? PTSD (post-traumatic stress disorder)        Social History:  Social History     Tobacco Use   ? Smoking status: Never   ? Smokeless tobacco: Never   Substance and Sexual Activity   ? Alcohol use: Yes     Comment: Rare       Outpatient Medications:  Current Outpatient Medications   Medication Sig   ? escitalopram 5 mg tablet Take 2.5 mg by mouth.   ? Ruxolitinib Phosphate (OPZELURA) 1.5 % CREA Apply twice Parsons day to affected area on face.   ? tacrolimus 0.03% ointment Apply topically.   ? terbinafine 1% cream Apply topically two (2) times daily APPLY 2-3 TIMES DAILY TO AFFECTED AREA(Parsons).     No current facility-administered medications for this visit.       Allergies:  No Known Allergies    Objective/ Assessment/ Plan:     Physical Examination:  Patient was offered Parsons chaperone, but declined.     General: Pleasant, in no acute distress, appears stated age  Psychiatric: Mood and affect are within normal limits    Skin type: 1    Pertinent skin findings as below:   - Depigmented patches on the right face (forehead, upper eyelid and right cheek) in V1/V2 distribution  - Poliosis of the right upper eyelashes. (also involvement of the right eyebrow but dyed today)    Pertinent Lab/Work-up:  05/2021: CBC, TSH, anti-TPO antibodies negative    Objective/ Assessment/ Plan:     #Segmental Vitiligo, BSA ~5%. Localized to right face- following COVID Vaccine.   #History of eczema   Segmental vitiligo of the Parsons face in the V1/V2 distribution. Interestingly, Parsons recent paper from September 2022 (1) demonstrated positive detection of VZV antigen was statistically associated in the epidermis with recent segmental  vitiligo and in the dermis with long-lasting segmental vitiligo. Patient also reported issues with eye twitching and ''nerve issues'' since his vitiligo erupted  Counseling/ Work-up:  - Discussed autoimmune nature with patient and that vitiligo can be triggered by immunotherapy such as with BRAF inhibitors or PD-1 inhibitors. In the latter setting, it is considered Parsons good prognostic sign. Rarely, vitiligo may be associated with uveitis.  - F/u with ophthalmology. Pt to be evaluated this week.  ?  Treatment:  -Parsons/p Valtrex. Plan to f/u in vitiligo clinic in 1 month.  -Continue MVN, ginkgo, heliocare, alpha lipoic acid  - Continue NbUVB 2-3x/ week- consent signed at last visit. Pt with mild erythema today suggestive of mild burning so will touch base with phototherapy team to decrease energy.   - Continue Tacrolimus 0.1% ointment BID to affected area.  - START Opzelura cream Bid to [facility]. Re-ordered today    Zachary Parsons Parsons, Zachary Parsons as Parsons potential therapy for mycosis fungoides and vitiligo in Parsons zosteriform pattern. Dermatol Ther. 2019 May;32(3):e12870. doi: 10.1111/dth.12870. Epub 2019 Mar 22. PMID: 47425956.     Zachary Parsons, Zachary Parsons, Zachary Parsons, Zachary Parsons, Zachary Parsons, Zachary Parsons, Zachary Parsons, Zachary Parsons. Varicella-zoster virus in actively spreading segmental vitiligo skin: Pathological, immunochemical, and ultrastructural findings (Parsons first and preliminary study). Pigment Cell Melanoma Res. 2022 Sep 16. doi: 10.1111/pcmr.38756. Epub ahead of print. PMID: 43329518.  ?  ---    The above plan of care, diagnosis, order, and follow-up were discussed with the patient.  Questions related to this recommended plan of care were answered.      RTC as scheduled with Dr. Latanya Parsons sooner if worsening    Zachary Hinchey Rodman Key, MD ~ [employer] Sciences Clinical Instructor  Division of Dermatology. [address] of [place], [place]

## 2021-11-09 NOTE — Patient Instructions
MyChart messages should be reserved for non-urgent and BRIEF questions relevant to issues covered during your visit. For urgent messages, please call. For issues not discussed during your visit, please make an appointment. These messages are only received during business hours.      For prescription refills, please check with your pharmacy for available refills prior to messaging

## 2021-11-11 ENCOUNTER — Ambulatory Visit: Payer: TRICARE (CHAMPUS)

## 2021-11-14 ENCOUNTER — Ambulatory Visit: Payer: TRICARE (CHAMPUS)

## 2021-11-14 ENCOUNTER — Telehealth: Payer: TRICARE (CHAMPUS)

## 2021-11-14 NOTE — Telephone Encounter
FCU Verification - Insurance Verification/Authorization     Regarding:   Insurance Verification [ ]   Authorization ]   Missing Documentation [ ]                       Date of Service: 11/15/2021     Provider:   PHOTOTHERAPY    Reason: 11/17/2021 appropriate reasoning   Jamie Kato ] Patient Not Financially Cleared   [ ]  No active Insurance Coverage   [ ]  Authorization submitted - Pending Approval (Turnaround Time  [ ]  Authorization Denied (Please see below denial Reason)   X[ ]  Patient has been contacted   [ ]  Patient would like to reschedule - Please contact Patient   [ ]  Patient has been advised of ABN form and agrees to sign - Auth Pending   [ ]  Patient to proceed as Self-Pay - Please contact Patient   [ ]  Procedure -out of scope  Loraine Leriche ] No auth file  [ ]  Self-pay  [ ]  Not contracted OTA required      Other (explain):

## 2021-11-15 ENCOUNTER — Ambulatory Visit: Payer: TRICARE (CHAMPUS)

## 2021-11-15 DIAGNOSIS — L80 Vitiligo: Secondary | ICD-10-CM

## 2021-11-17 ENCOUNTER — Ambulatory Visit: Payer: TRICARE (CHAMPUS)

## 2021-11-22 ENCOUNTER — Ambulatory Visit: Payer: TRICARE (CHAMPUS)

## 2021-11-22 DIAGNOSIS — H43391 Other vitreous opacities, right eye: Secondary | ICD-10-CM

## 2021-11-22 DIAGNOSIS — L80 Vitiligo: Secondary | ICD-10-CM

## 2021-11-22 DIAGNOSIS — R253 Fasciculation: Secondary | ICD-10-CM

## 2021-11-22 NOTE — Progress Notes
Zachary Parsons is a 31 y.o. male presents to [facility] on 11/22/2021 from [facility] for right eyelid twitching uncontrollably and randomly, used to be right upper lid, now right lower lid, and pain right eye, and light sensitivity x 1 yr, worse 6 months ago.   Vitiligo spread over right eye about 1 yr ago and started having issues with right eye     Started seeing Floaters right eye x 4 months ago. No flashes, no curtain, no trauma    No gtt, no eye surgery   Base Eye Exam     Visual Acuity (Snellen - Linear)       Right Left    Dist cc 20/20 -2 20/20    Correction: Glasses          Tonometry (Applanation, 12:39 PM)       Right Left    Pressure 12 11          Pupils       Dark Light Shape React APD    Right 5 4.5 Round Brisk None    Left 5 4.5 Round Brisk None          Visual Fields       Left Right     Full Full          Extraocular Movement       Right Left     Full Full          Neuro/Psych     Oriented x3: Yes    Mood/Affect: Normal          Dilation     Both eyes: 1.0% Mydriacyl, 2.5% Phenylephrine @ 12:40 PM   Gave sunglasses              Slit Lamp and Fundus Exam     External Exam       Right Left    External vertiligo Normal          Slit Lamp Exam       Right Left    Lids/Lashes white lashes Normal    Conjunctiva/Sclera White and quiet White and quiet    Cornea Clear Clear    Anterior Chamber Deep and quiet Deep and quiet    Iris Normal Normal    Lens Clear Clear          Fundus Exam       Right Left    Vitreous Normal Normal    Disc Normal Normal    Macula Normal Normal    Vessels Normal Normal    Periphery Normal Normal            Refraction     Wearing Rx       Sphere Cylinder Axis    Right -6.75 +0.25 070    Left -7.25 +0.50 087    Age: ~1 yr    Type: distance                Assessment and Plan     Problem List        Eye / Vision Problems    1. Floaters in visual field, right     Overview      Came to [place] with floaters right eye for 4 months   - discussed findings and treatment - Retinal detachment precautions given          2. Twitching     Overview      Came to [place] with twitching for one year -  on and off  - Discussed findings and treatment - recommended artificial tears and   observation - return precautions given   - can see oculoplastic for Botox if worse            Other    3. Vitiligo  Floaters in visual field, right       Twitching              Orders:  No orders of the defined types were placed in this encounter.      Follow ups:  Return if symptoms worsen or fail to improve, for oculoplastic.         I discussed the above assessment and plan with the patient. He had the opportunity to ask questions, and his questions and concerns were addressed. He was reminded to call if there is any significant change or worsening in vision, or to get an evaluation, urgently if appropriate.    Author:   Jossie Ng. Nyjah Denio, MD  This note details the assessment and plan of the encounter for this date. For a complete note and record of the encounter, please see the Encounter Summary.

## 2021-11-22 NOTE — Patient Instructions
Recommend:    1- Cold compresses, 3-4 times a day or more to decrease the discomfort    2- Artificial Tears:     Over-the-counter drops that can soothe the surface of the eye. Look for products that say ''tears'' or ''lubricant eye drop''. In general avoid ''redness relievers''.  Consider using NON-preservative or preservative-free drops principally if you use them more than 4 times a day.    You can use artificial tears as often as necessary -- once or twice a day or as often as several time an hour.    There are many brands on the market, so you may want to try several to find the one you like best.  Some  Of those brands are Refresh, Systane, GenTeal...    For the night you can try a Night time gel or ointment: before bed time place the gel or ointment inside the lower lid. Remember it will blur the vision. Try either Genteal gel, or Refresh PM, or similar product.    3- 20/20/20: Because we don't blink as much when we are visually concentrating, like we do when we are on a computer or reading a book, taking frequent breaks during visual activities can minimize irritation. Try taking a 20 second break every 20 minutes, or so, and look at something 20 feet or further away. Let the eyes blink a bit and then resume your activity. This is also a great time to think about adding an artificial tear drop.      Return if decrease of vision, redness, light sensitivity, pain.

## 2021-12-05 NOTE — Progress Notes
PATIENT: Zachary Parsons  MRN: [id number]  DOB: [birth year]  DATE OF SERVICE: 12/07/2021    REFERRING PRACTITIONER: No ref. provider found  PRIMARY CARE PROVIDER: Talmage Nap, FNP    CC: f/u vilitgo    SUBJECTIVE:  Zachary Parsons is a 31 y.o. year old male who presents for follow-up for evaluation of vitiligo.    LCV 11/09/21 with Dr. Lovena Le for vitiligo, started Opzelura cream BID, continued phototherapy 2-3x a week.     Interval hx:  - Insurance has not approved Opzelura, so he never started it  - He continues on tacrolimus  - He was on phototherapy 2-3x a week in [facility], but auth had expired, has not had a session in ~3 weeks. He feels that it had helped a little, as the edges of his vitiligo had gotten darker.    Review of systems:  Constitional: Negative  Skin: Otherwise negative    PMH/Family Hx:  Reviewed  Personal history of skin cancer: none  Family history of melanoma: none    Social History:  Reviewed    Meds:  Current Outpatient Medications   Medication Sig   ? escitalopram 5 mg tablet Take 2.5 mg by mouth.   ? Ruxolitinib Phosphate (OPZELURA) 1.5 % CREA Apply twice a day to affected area on face.   ? tacrolimus 0.03% ointment Apply topically.   ? terbinafine 1% cream Apply topically two (2) times daily APPLY 2-3 TIMES DAILY TO AFFECTED AREA(S).     No current facility-administered medications for this visit.       Allergies:  No Known Allergies   ?  OBJECTIVE:    General: Well-developed, well-nourished, NAD, cooperative  Psych: normal mood and affect  Skin exam performed as listed below.    Head/face: examined  Eyes (lids/conjunctiva): examined  Lips: examined    Exam notable for:  - R face (forehead, upper eyelid and right cheek) with confluent depigmented patches in V1/V2 distribution, with some follicular repigmentation on R nasal bridge  - Poliosis of the right upper eyelashes.            Labs 06/06/21:  TSH WNL    ASSESSMENT/PLAN:    1. Vitiligo  Chronic condition, segmental with BSA ~5%, localized to R side of face.   - Continue nbUVB 3x/week. Already re-ordered by Dr. Lovena Le, instructed patient to call [facility] office to set up appointments,.  - Rx Ruxolitinib Phosphate (OPZELURA) 1.5 % CREA; Apply twice a day to affected area on face.  Dispense: 60 g; Refill: 6, sent to [facility]. Also provided patient with Opzelura co-savings card to prevent to pharmacy. Discussed side effects, notably acne.    Follow-up:  2-3 months with Dr. Latanya Maudlin, or as needed sooner for any new, changing, evolving, or concerning lesions or symptoms.    The above plan of care, diagnosis, orders, and follow-up were discussed with the patient.  Questions related to this recommended plan of care were answered.     The patient was seen and examined with the attending, Dr. Marcelle Overlie and Dr. Latanya Maudlin, who agrees with the above assessment and plan.    Oren Bracket, MD  Resident Physician  [facility]  12/05/2021  10:11 AM

## 2021-12-07 ENCOUNTER — Ambulatory Visit: Payer: TRICARE (CHAMPUS) | Attending: Student in an Organized Health Care Education/Training Program

## 2021-12-07 MED ORDER — OPZELURA 1.5 % EX CREA
6 refills | Status: AC
Start: 2021-12-07 — End: 2021-12-08

## 2021-12-07 MED ORDER — OPZELURA 1.5 % EX CREA
6 refills | Status: AC
Start: 2021-12-07 — End: ?

## 2021-12-09 ENCOUNTER — Ambulatory Visit: Payer: TRICARE (CHAMPUS)

## 2021-12-12 ENCOUNTER — Ambulatory Visit: Payer: TRICARE (CHAMPUS)

## 2021-12-12 DIAGNOSIS — L80 Vitiligo: Secondary | ICD-10-CM

## 2021-12-12 NOTE — Interdisciplinary
Patient states tolerated treatment from last visit  well,  Patient states had no burning or reaction.    Mineral Oil was provided and applied to affected areas of the body    Last Dermatology Doctor Visit: 12/07/2021  Patient to follow up 3-6 mo    Patient advised to contact office if any concerns to today's treatment.  Office number:  [phone]

## 2021-12-12 NOTE — Telephone Encounter
Please read mychart message below. LV:Dr. Ether Griffins on 12/05/21.    ASSESSMENT/PLAN:    1. Vitiligo  Chronic condition, segmental with BSA ~5%, localized to R side of face.   - Continue nbUVB 3x/week. Already re-ordered by Dr. Lovena Le, instructed patient to call [facility] office to set up appointments,.  - Rx Ruxolitinib Phosphate (OPZELURA) 1.5 % CREA; Apply twice a day to affected area on face.  Dispense: 60 g; Refill: 6, sent to [facility]. Also provided patient with Opzelura co-savings card to prevent to pharmacy. Discussed side effects, notably acne.    Follow-up:  2-3 months with Dr. Latanya Maudlin, or as needed sooner for any new, changing, evolving, or concerning lesions or symptoms.    The above plan of care, diagnosis, orders, and follow-up were discussed with the patient. Questions related to this recommended plan of care were answered.     The patient was seen and examined with the attending, Dr. Marcelle Overlie and Dr. Latanya Maudlin, who agrees with the above assessment and plan.    Oren Bracket, MD  Resident Physician  [facility]ermat[facility]  12/05/2021  10:11 AM

## 2021-12-12 NOTE — Telephone Encounter
Good afternoon everyone, Opzelura needs PA. Please assist. Many thanks for your help!

## 2021-12-13 NOTE — Telephone Encounter
Forms for PA received today via fax.   Filled out and faxed back,   Awaiting response.

## 2021-12-14 ENCOUNTER — Ambulatory Visit: Payer: TRICARE (CHAMPUS)

## 2021-12-14 DIAGNOSIS — L80 Vitiligo: Secondary | ICD-10-CM

## 2021-12-14 NOTE — Interdisciplinary
Patient states tolerated treatment from last visit  well,  Patient states had no burning or reaction.    Mineral Oil was provided and applied to affected areas of the body    Last Dermatology Doctor Visit: 12/07/2021  Patient to follow up 3-6 mo    Patient advised to contact office if any concerns to today's treatment.  Office number:  [phone]

## 2021-12-16 ENCOUNTER — Ambulatory Visit: Payer: TRICARE (CHAMPUS)

## 2021-12-19 ENCOUNTER — Ambulatory Visit: Payer: TRICARE (CHAMPUS)

## 2021-12-21 ENCOUNTER — Ambulatory Visit: Payer: TRICARE (CHAMPUS)

## 2021-12-21 DIAGNOSIS — L80 Vitiligo: Secondary | ICD-10-CM

## 2021-12-21 NOTE — Interdisciplinary
Patient states tolerated treatment from last visitwell, Patient states had no burning or reaction.    Mineral Oil was provided and applied to affected areas of the body    Last Dermatology Doctor Visit:12/07/2021  Patient to follow up3-6 mo    Patient advised to contact office if any concerns to today's treatment. Office number: [phone]

## 2021-12-23 ENCOUNTER — Ambulatory Visit: Payer: TRICARE (CHAMPUS)

## 2021-12-23 DIAGNOSIS — L80 Vitiligo: Secondary | ICD-10-CM

## 2021-12-23 NOTE — Interdisciplinary
Patient states tolerated treatment from last visitwell, Patient states had no burning or reaction.    Mineral Oil was provided and applied to affected areas of the body    Last Dermatology Doctor Visit:12/07/2021  Patient to follow up3-6 mo    Patient advised to contact office if any concerns to today's treatment. Office number: [phone]

## 2021-12-26 ENCOUNTER — Ambulatory Visit: Payer: TRICARE (CHAMPUS)

## 2021-12-26 DIAGNOSIS — L80 Vitiligo: Secondary | ICD-10-CM

## 2021-12-26 NOTE — Interdisciplinary
Patient states tolerated treatment from last visitwell, Patient states had no burning or reaction.    Mineral Oil was provided and applied to affected areas of the body    Last Dermatology Doctor Visit:12/07/2021  Patient to follow up3-6 mo    Patient advised to contact office if any concerns to today's treatment. Office number: [phone]

## 2021-12-26 NOTE — Progress Notes
I saw and evaluated the patient.  I discussed the case with the participating resident, Norberta Keens, MD and volunteer attending, Pleas Patricia, MD and agree with the findings and plan of care as described above.  I was present for the critical or key portions of the visit and immediately available throughout the visit.

## 2021-12-28 ENCOUNTER — Ambulatory Visit: Payer: TRICARE (CHAMPUS)

## 2021-12-28 DIAGNOSIS — L80 Vitiligo: Secondary | ICD-10-CM

## 2021-12-28 NOTE — Interdisciplinary
Patient states tolerated treatment from last visitwell, Patient states had no burning or reaction.    Mineral Oil was provided and applied to affected areas of the body    Last Dermatology Doctor Visit:12/07/2021  Patient to follow up3-6 mo    Patient advised to contact office if any concerns to today's treatment. Office number: [phone]

## 2021-12-30 ENCOUNTER — Ambulatory Visit: Payer: TRICARE (CHAMPUS)

## 2021-12-30 DIAGNOSIS — L80 Vitiligo: Secondary | ICD-10-CM

## 2021-12-30 NOTE — Interdisciplinary
Patient states tolerated treatment from last visitwell, Patient states had no burning or reaction.    Mineral Oil was provided and applied to affected areas of the body    Last Dermatology Doctor Visit:12/07/2021  Patient to follow up3-6 mo    Patient advised to contact office if any concerns to today's treatment. Office number: [phone]

## 2022-01-04 ENCOUNTER — Ambulatory Visit: Payer: TRICARE (CHAMPUS)

## 2022-01-04 DIAGNOSIS — L80 Vitiligo: Secondary | ICD-10-CM

## 2022-01-04 NOTE — Interdisciplinary
Patient states tolerated treatment from last visitwell, Patient states had no burning or reaction.    Mineral Oil was provided and applied to affected areas of the body    Last Dermatology Doctor Visit:12/07/2021  Patient to follow up3-6 mo    Patient advised to contact office if any concerns to today's treatment. Office number: [phone]

## 2022-01-06 ENCOUNTER — Ambulatory Visit: Payer: TRICARE (CHAMPUS)

## 2022-01-09 ENCOUNTER — Ambulatory Visit: Payer: TRICARE (CHAMPUS)

## 2022-01-09 DIAGNOSIS — L80 Vitiligo: Secondary | ICD-10-CM

## 2022-01-09 NOTE — Interdisciplinary
Patient states tolerated treatment from last visitwell, Patient states had no burning or reaction.    Mineral Oil was provided and applied to affected areas of the body    Last Dermatology Doctor Visit:12/07/2021  Patient to follow up3-6 mo    Patient advised to contact office if any concerns to today's treatment. Office number: [phone]

## 2022-01-11 ENCOUNTER — Ambulatory Visit: Payer: TRICARE (CHAMPUS)

## 2022-01-11 DIAGNOSIS — L80 Vitiligo: Secondary | ICD-10-CM

## 2022-01-11 NOTE — Interdisciplinary
Patient states tolerated treatment from last visitwell, Patient states had no burning or reaction.    Mineral Oil was provided and applied to affected areas of the body    Last Dermatology Doctor Visit:12/07/2021  Patient to follow up3-6 mo    Patient advised to contact office if any concerns to today's treatment. Office number: [phone]

## 2022-01-12 ENCOUNTER — Ambulatory Visit: Payer: TRICARE (CHAMPUS)

## 2022-01-13 ENCOUNTER — Ambulatory Visit: Payer: TRICARE (CHAMPUS)

## 2022-01-13 DIAGNOSIS — L80 Vitiligo: Secondary | ICD-10-CM

## 2022-01-13 NOTE — Interdisciplinary
Patient states tolerated treatment from last visitwell, Patient states had no burning or reaction.    Mineral Oil was provided and applied to affected areas of the body    Last Dermatology Doctor Visit:12/07/2021  Patient to follow up3-6 mo    Patient advised to contact office if any concerns to today's treatment. Office number: [phone]

## 2022-01-18 ENCOUNTER — Ambulatory Visit: Payer: TRICARE (CHAMPUS)

## 2022-01-18 DIAGNOSIS — L80 Vitiligo: Secondary | ICD-10-CM

## 2022-01-18 NOTE — Interdisciplinary
Patient states tolerated treatment from last visitwell, Patient states had no burning or reaction.     small goggles are used       Last Dermatology Doctor Visit:12/07/2021  Patient to follow up3-6 mo    Patient advised to contact office if any concerns to today's treatment. Office number: [phone]

## 2022-01-20 ENCOUNTER — Ambulatory Visit: Payer: TRICARE (CHAMPUS)

## 2022-01-20 DIAGNOSIS — L80 Vitiligo: Secondary | ICD-10-CM

## 2022-01-20 NOTE — Interdisciplinary
Patient states tolerated treatment from last visitwell, Patient states had no burning or reaction.     small goggles are used       Last Dermatology Doctor Visit:12/07/2021  Patient to follow up3-6 mo    Patient advised to contact office if any concerns to today's treatment. Office number: [phone]

## 2022-01-23 ENCOUNTER — Ambulatory Visit: Payer: TRICARE (CHAMPUS)

## 2022-01-23 DIAGNOSIS — L80 Vitiligo: Secondary | ICD-10-CM

## 2022-01-23 NOTE — Interdisciplinary
Patient states tolerated treatment from last visitwell, Patient states had no burning or reaction.    small goggles are used     Last Dermatology Doctor Visit:12/07/2021  Patient to follow up3-6 mo    Patient advised to contact office if any concerns to today's treatment. Office number: [phone]

## 2022-01-24 ENCOUNTER — Ambulatory Visit: Payer: TRICARE (CHAMPUS)

## 2022-01-25 ENCOUNTER — Ambulatory Visit: Payer: TRICARE (CHAMPUS) | Attending: Student in an Organized Health Care Education/Training Program

## 2022-01-25 ENCOUNTER — Ambulatory Visit: Payer: TRICARE (CHAMPUS)

## 2022-01-25 DIAGNOSIS — Z20822 Suspected COVID-19 virus infection: Secondary | ICD-10-CM

## 2022-01-25 DIAGNOSIS — R9431 Abnormal electrocardiogram [ECG] [EKG]: Secondary | ICD-10-CM

## 2022-01-25 DIAGNOSIS — R079 Chest pain, unspecified: Secondary | ICD-10-CM

## 2022-01-25 DIAGNOSIS — B349 Viral infection, unspecified: Secondary | ICD-10-CM

## 2022-01-25 DIAGNOSIS — R6889 Other general symptoms and signs: Secondary | ICD-10-CM

## 2022-01-25 DIAGNOSIS — L80 Vitiligo: Secondary | ICD-10-CM

## 2022-01-25 MED ORDER — MENTHOL 5 MG MT LOZG
1 | LOZENGE | OROMUCOSAL | 0 refills | Status: AC | PRN
Start: 2022-01-25 — End: ?

## 2022-01-25 MED ORDER — FLUTICASONE PROPIONATE 50 MCG/ACT NA SUSP
1 | Freq: Every day | NASAL | 0 refills | Status: AC
Start: 2022-01-25 — End: ?

## 2022-01-25 MED ORDER — NETI POT SINUS WASH 2300-700 MG NA KIT
1 | Freq: Every day | NASAL | 0 refills | Status: AC | PRN
Start: 2022-01-25 — End: ?

## 2022-01-25 MED ORDER — LORATADINE 10 MG PO TABS
10 mg | ORAL_TABLET | Freq: Every day | ORAL | 0 refills | Status: AC | PRN
Start: 2022-01-25 — End: ?

## 2022-01-25 MED ORDER — BENZONATATE 200 MG PO CAPS
200 mg | ORAL_CAPSULE | Freq: Three times a day (TID) | ORAL | 0 refills | Status: AC | PRN
Start: 2022-01-25 — End: ?

## 2022-01-25 MED ORDER — CHLORASEPTIC 1.4 % MT LIQD
1 | Freq: Four times a day (QID) | OROMUCOSAL | 0 refills | 7.50000 days | Status: AC | PRN
Start: 2022-01-25 — End: ?

## 2022-01-25 MED ORDER — LORATADINE 10 MG PO TABS
ORAL_TABLET
Start: 2022-01-25 — End: ?

## 2022-01-25 MED ORDER — IBUPROFEN 600 MG PO TABS
600 mg | ORAL_TABLET | Freq: Four times a day (QID) | ORAL | 2 refills | Status: AC | PRN
Start: 2022-01-25 — End: ?

## 2022-01-25 NOTE — Patient Instructions
If you have worsening or persistent chest pain, go to ER

## 2022-01-25 NOTE — Interdisciplinary
Patient states tolerated treatment from last visitwell, Patient states had no burning or reaction.    small goggles are used     Last Dermatology Doctor Visit:12/07/2021  Patient to follow up3-6 mo    Patient advised to contact office if any concerns to today's treatment. Office number: [phone]

## 2022-01-25 NOTE — Progress Notes
PATIENT: Zachary Parsons  MRN: [id number]  DOB: 05-14-1991  DATE OF SERVICE: 01/25/2022    CHIEF COMPLAINT:   Chief Complaint   Patient presents with   ? Cough   ? Nasal Congestion       Subjective:      Zachary Parsons is a 31 y.o. male presents for cough x 4 days   +nasal congestion   Using nyquil, dayquil, cough drops without relief   No family h/o SCD  Not followed by cardio   H/o anxiety/PTSD, in weekly therapy, no oral tx  H/o panic attack  +CP x 15yr, intermittent, L lower chest, feels like when has panic attack, described as stinging, non radiating, self resolves, lasts 15 mins, typically at night when in bed, last episode last week.   -tobacco use  -asthma hx   Denies fever, rash, ear pain, sore throat, abd pain, n/v/d, CP, SOB, exercise changes, heavy lifting, trauma or any other pain or sxs.     Review of Systems   Constitutional: Negative for chills and fever.   HENT: Positive for congestion, rhinorrhea and sore throat. Negative for ear discharge, ear pain, trouble swallowing and voice change.    Respiratory: Positive for cough. Negative for shortness of breath.    Cardiovascular: Positive for chest pain. Negative for palpitations.   Gastrointestinal: Negative for abdominal pain, diarrhea, nausea and vomiting.   Skin: Negative for rash.   All other systems reviewed and are negative.        Objective:      Physical Exam  Vitals and nursing note reviewed.   Constitutional:       General: He is not in acute distress.     Appearance: Normal appearance. He is not ill-appearing, toxic-appearing or diaphoretic.   HENT:      Head: Normocephalic and atraumatic.      Right Ear: External ear normal.      Left Ear: External ear normal.      Nose: Nose normal. No congestion or rhinorrhea.      Mouth/Throat:      Mouth: Mucous membranes are moist.      Pharynx: Oropharynx is clear. Posterior oropharyngeal erythema present. No oropharyngeal exudate.      Comments: Uvula midline  No trismus  Airway patent   Eyes:      General: No scleral icterus.        Right eye: No discharge.         Left eye: No discharge.      Conjunctiva/sclera: Conjunctivae normal.   Cardiovascular:      Rate and Rhythm: Normal rate and regular rhythm.      Pulses: Normal pulses.      Heart sounds: Normal heart sounds. No murmur heard.  Pulmonary:      Effort: Pulmonary effort is normal. No respiratory distress.      Breath sounds: Normal breath sounds. No stridor. No wheezing, rhonchi or rales.      Comments: No CW trauma/rash noted   Chest:      Chest wall: No tenderness.   Abdominal:      General: Bowel sounds are normal. There is no distension.      Palpations: Abdomen is soft.      Tenderness: There is no abdominal tenderness. There is no guarding.   Musculoskeletal:         General: Normal range of motion.   Lymphadenopathy:      Cervical: Cervical adenopathy (lateral) present.   Skin:  General: Skin is warm and dry.      Capillary Refill: Capillary refill takes less than 2 seconds.      Coloration: Skin is not jaundiced or pale.      Findings: No bruising, erythema, lesion or rash.   Neurological:      Mental Status: He is alert and oriented to person, place, and time.   Psychiatric:         Mood and Affect: Mood normal.         Behavior: Behavior normal.           Assessment:     Viral syndrome: lungs CTAB, VSS, tol PO well, well appearing, Will swab for COVID. Discussed supportive care, f/u with PCP, strict ER precautions. He expressed understanding, all questions answered.     CP, intermittent: no active CP, h/o anxiety with panic attack. No skin changes or CW ttp on exam. Will order EKG r/o myo/pericarditis.     EKG #1, 12:11: NSR 80, LAD, TWI II, III, AVF, V4-6, no wellens, no q waves, no S1Q3, no comparision EKG; interp by me     EKG #2, 12:28: NSR 80, LAD, TWI II, III, AVF V4-6, unchanged from previous; interp by me     No active CP. Will refer to cardio OP for w/u.       Plan:       Supportive care  F/u with PCP   Strict ER precautions  Refer to cardio  Rx sent   COVID sent           Author:  Margean Korell M. Roselyn Meier 01/25/2022 11:49 AM

## 2022-01-26 LAB — COVID-19 PCR/TMA

## 2022-01-27 ENCOUNTER — Ambulatory Visit: Payer: TRICARE (CHAMPUS)

## 2022-01-27 DIAGNOSIS — L80 Vitiligo: Secondary | ICD-10-CM

## 2022-01-27 NOTE — Interdisciplinary
Patient states tolerated treatment from last visitwell, Patient states had no burning or reaction.    Last Dermatology Doctor Visit:12/07/2021  Patient to follow up3-6 mo    Patient advised to contact office if any concerns to today's treatment. Office number: [phone]

## 2022-01-28 MED ORDER — LORATADINE 10 MG PO TABS
ORAL_TABLET
Start: 2022-01-28 — End: ?

## 2022-01-30 ENCOUNTER — Ambulatory Visit: Payer: TRICARE (CHAMPUS)

## 2022-01-30 DIAGNOSIS — L80 Vitiligo: Secondary | ICD-10-CM

## 2022-01-30 NOTE — Interdisciplinary
Patient states tolerated treatment from last visitwell, Patient states had no burning or reaction.    Last Dermatology Doctor Visit:12/07/2021  Patient to follow up3-6 mo    Patient advised to contact office if any concerns to today's treatment. Office number: [phone]

## 2022-02-01 ENCOUNTER — Ambulatory Visit: Payer: TRICARE (CHAMPUS)

## 2022-02-01 DIAGNOSIS — L80 Vitiligo: Secondary | ICD-10-CM

## 2022-02-01 NOTE — Interdisciplinary
Patient states tolerated treatment from last visitwell, Patient states had no burning or reaction.    Last Dermatology Doctor Visit:12/07/2021  Patient to follow up3-6 mo    Patient advised to contact office if any concerns to today's treatment. Office number: [phone]

## 2022-02-07 ENCOUNTER — Ambulatory Visit: Payer: TRICARE (CHAMPUS)

## 2022-03-23 MED ORDER — FLUTICASONE PROPIONATE 50 MCG/ACT NA SUSP: Start: 2022-03-23 — End: ?

## 2022-03-29 MED ORDER — FLUTICASONE PROPIONATE 50 MCG/ACT NA SUSP: Start: 2022-03-29 — End: ?

## 2022-04-26 IMAGING — RF ARTHROGRAM HIP RT
1 series · 1 of 1 positions shown · IV contrast (prohance)
Comparison: none

Images Obtained from [place] [facility]
INDICATION FOR PROCEDURE:  Pain in right hip.
PROCEDURE:  Right hip arthrogram with injection of contrast under fluoroscopic guidance.
RADIATION DOSE:
Number of fluoroscopic images:  1
Fluoroscopy time: 23 seconds
Dose area product: 0.046 mGy*m2
Reference air kerma: 4.00 mGy
DESCRIPTION OF PROCEDURE: Informed consent was obtained. The patient's right groin was evaluated for access. The skin overlying the area was prepped and draped sterilely. Lidocaine was used as local
anesthetic. A 22-gauge spinal needle was inserted into the hip joint, confirmed by contrast injection. A cocktail of 20 mL of normal saline with 0.1 mL of ProHance was injected without difficulty.
Needle was removed and bandage applied.

[Series 1: fluoro_arthrogram_(person_name)_(person_ · 0.17mm/px · 1 of 1 slices shown]
[im 1/1]
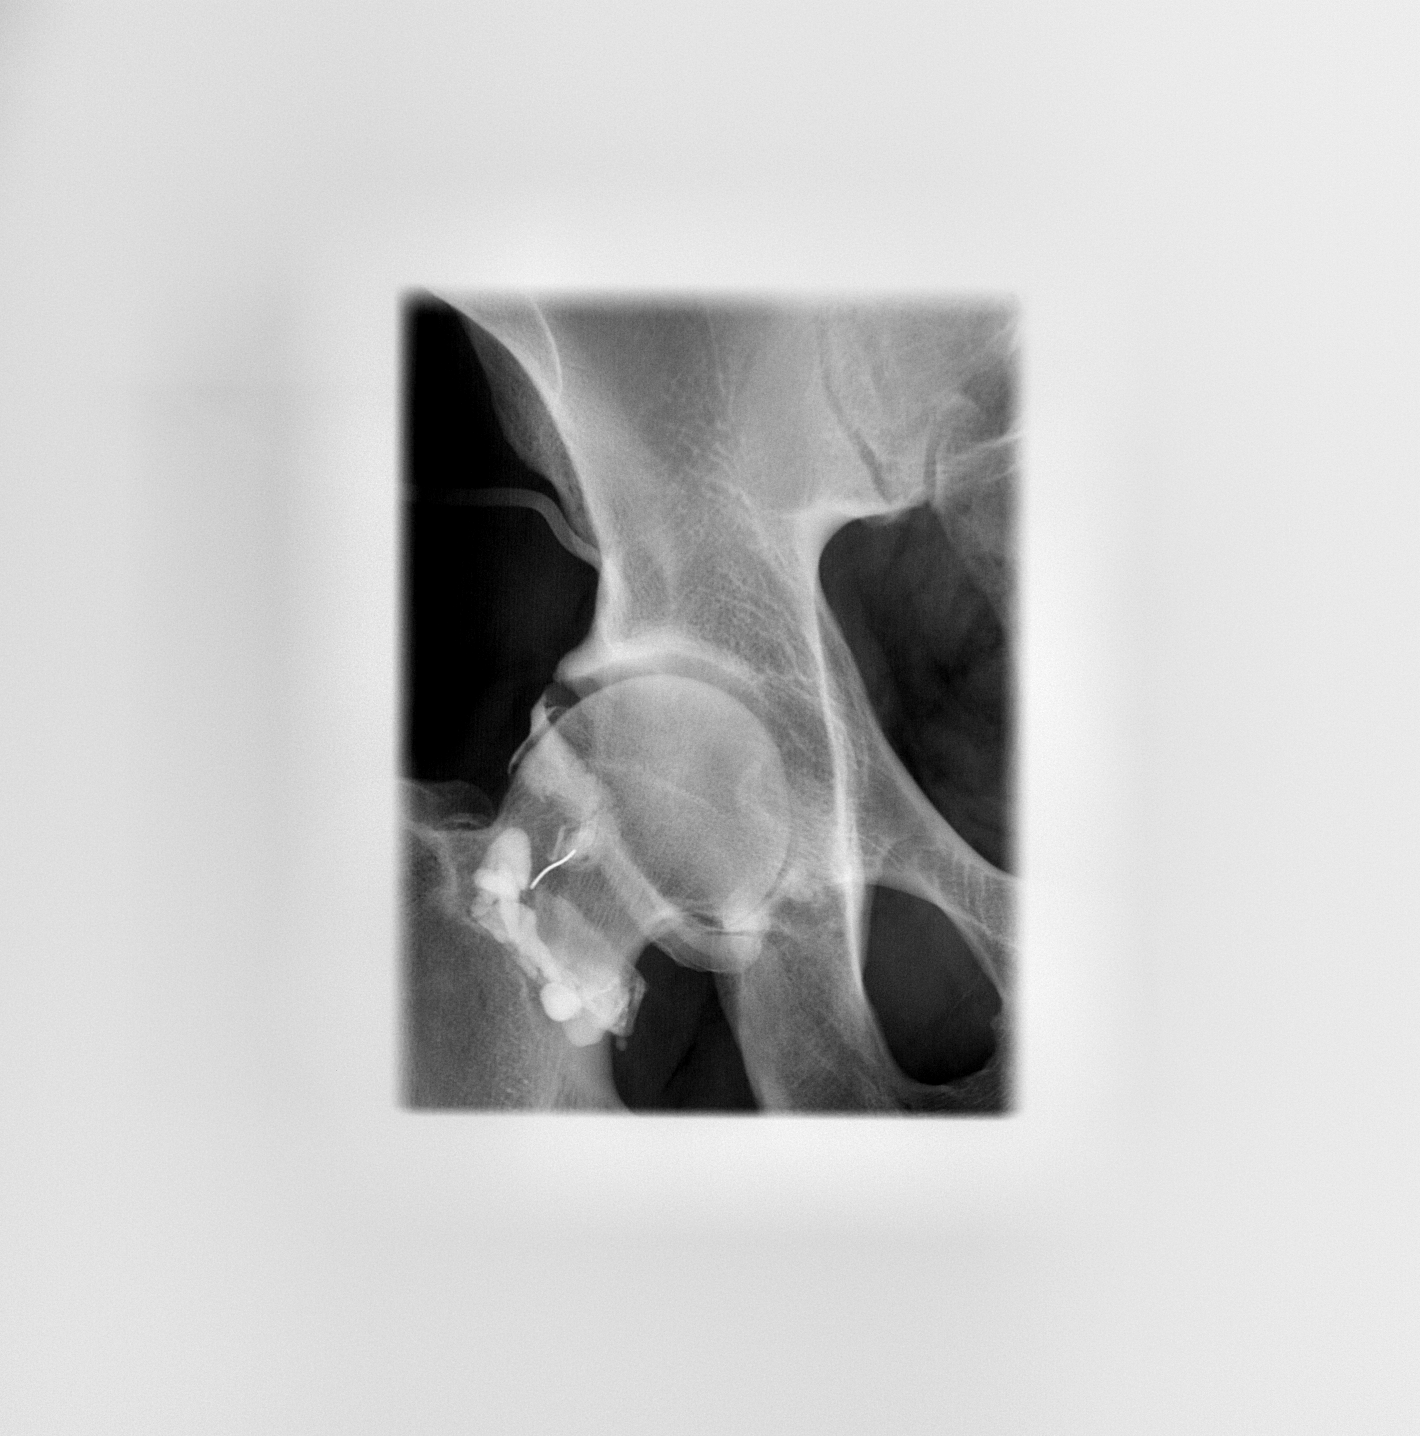

[1 of 1 positions shown; findings below may reference images not displayed]

IMPRESSION: Successful right hip injection for arthrography.

## 2022-05-09 IMAGING — MR MRI KNEE LT WO CONTRAST
5 series · 40 of 40 positions shown · non-contrast
Comparison: None.

Images Obtained from [facility]
INDICATION: Pain in left knee
TECHNIQUE: Multiplanar, multiecho imaging of the left knee was performed, including T1-weighted and fluid sensitive sequences without intravenous contrast administration.

[Series 2: t2_axial_fs · axial · 4.0mm · 0.44mm/px · z∈[-62,+52]mm · 8 of 24 slices shown]
[im 1/24]
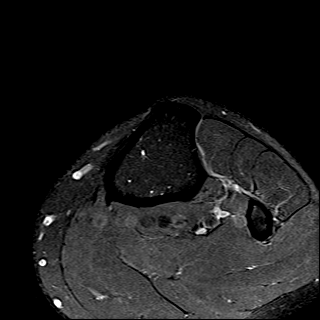
[im 4/24]
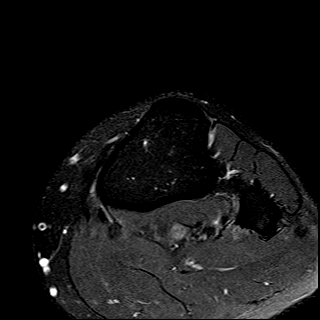
[im 7/24]
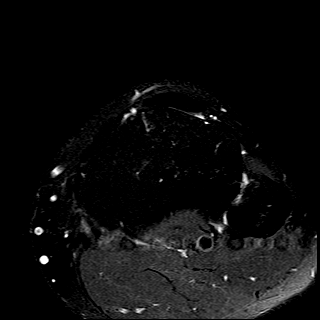
[im 10/24]
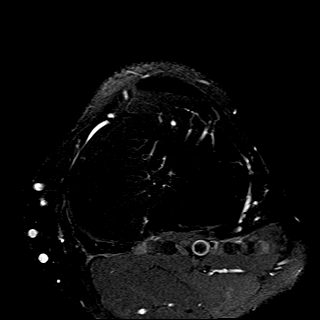
[im 14/24]
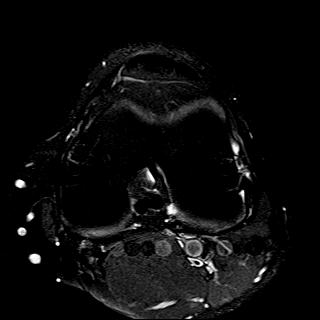
[im 17/24]
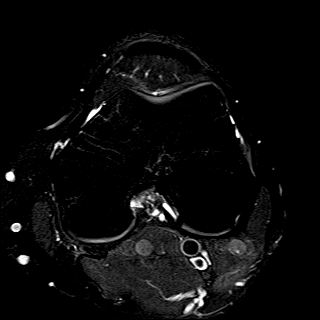
[im 20/24]
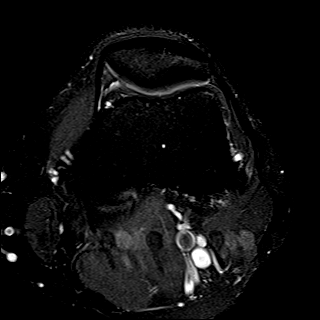
[im 24/24]
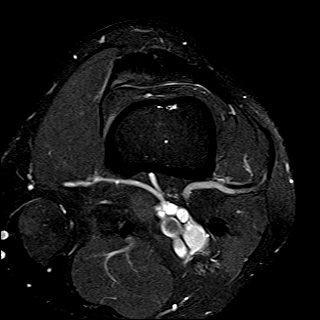

[Series 4: pd_sag_fs · sagittal · 3.0mm · 0.57mm/px · 9 of 30 slices shown]
[im 1/30]
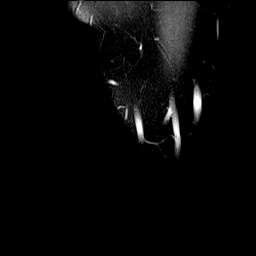
[im 4/30]
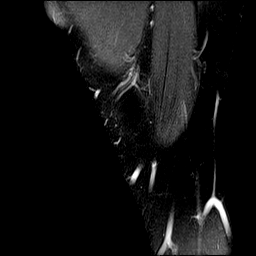
[im 8/30]
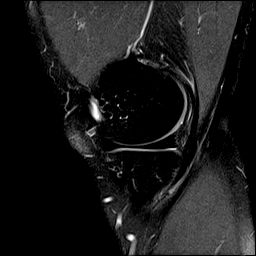
[im 11/30]
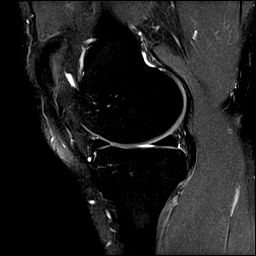
[im 15/30]
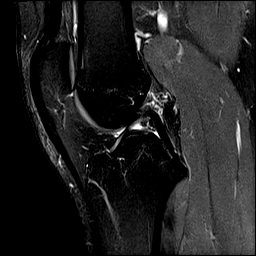
[im 19/30]
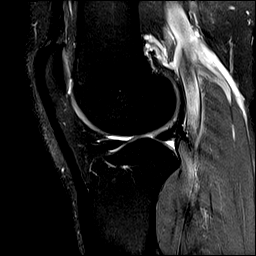
[im 22/30]
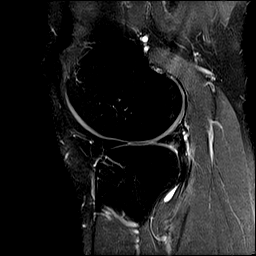
[im 26/30]
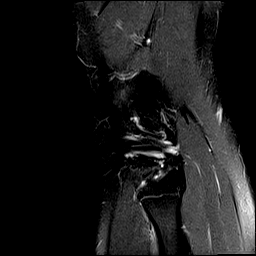
[im 30/30]
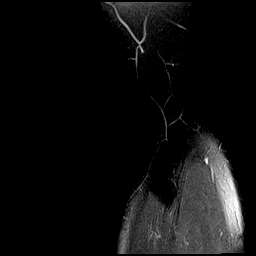

[Series 5: t1_cor · coronal · 4.0mm · 0.44mm/px · 7 of 22 slices shown]
[im 1/22]
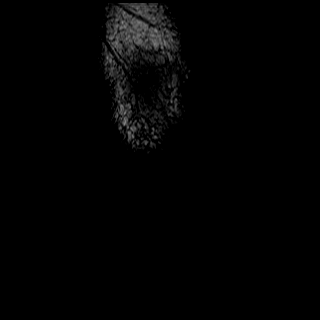
[im 4/22]
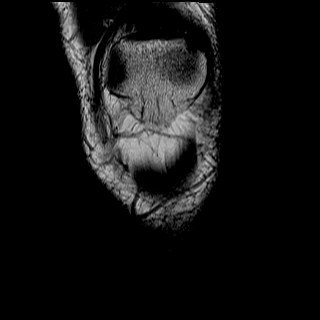
[im 8/22]
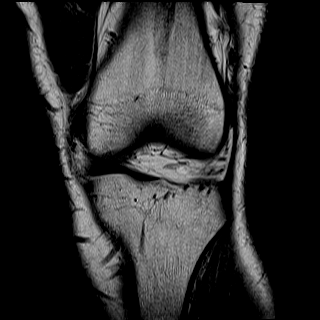
[im 11/22]
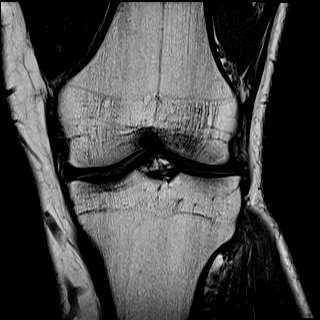
[im 15/22]
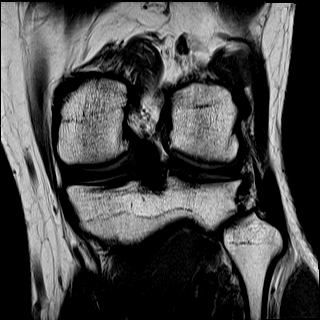
[im 18/22]
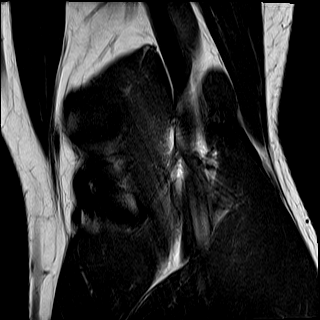
[im 22/22]
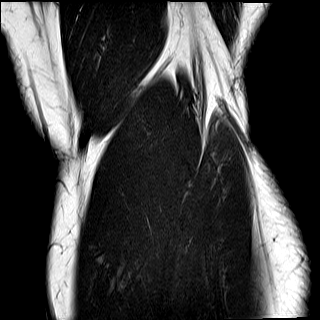

[Series 6: t2_sag_fs · sagittal · 3.0mm · 0.57mm/px · 9 of 30 slices shown]
[im 1/30]
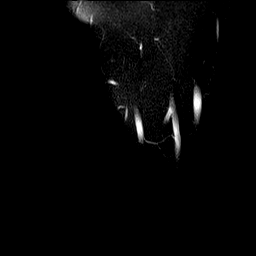
[im 4/30]
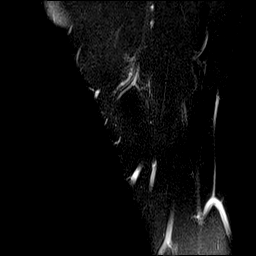
[im 8/30]
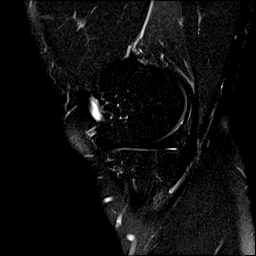
[im 11/30]
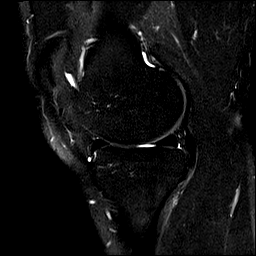
[im 15/30]
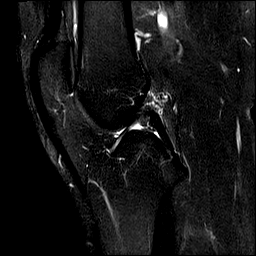
[im 19/30]
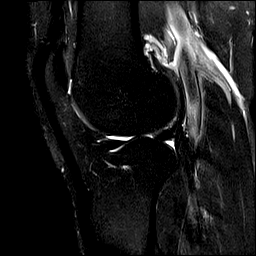
[im 22/30]
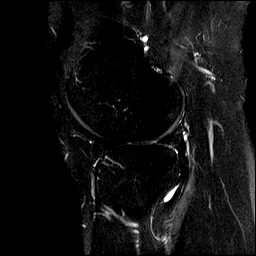
[im 26/30]
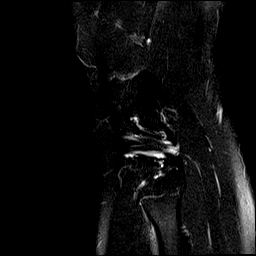
[im 30/30]
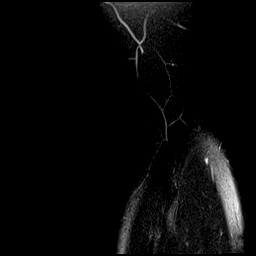

[Series 7: t2_cor_fs · coronal · 4.0mm · 0.44mm/px · 7 of 22 slices shown]
[im 1/22]
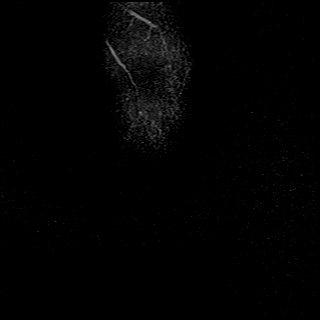
[im 4/22]
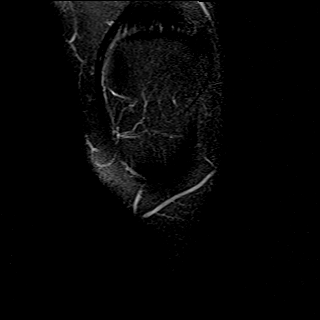
[im 8/22]
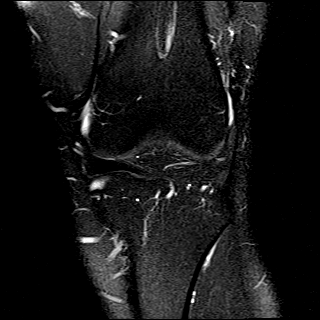
[im 11/22]
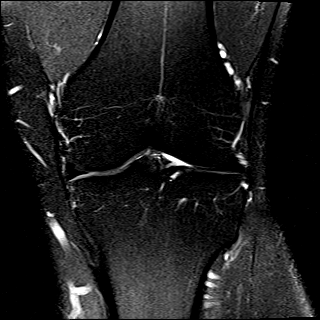
[im 15/22]
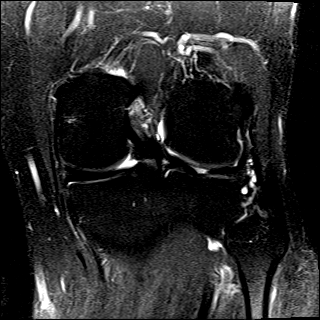
[im 18/22]
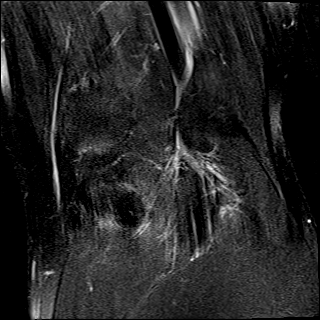
[im 22/22]
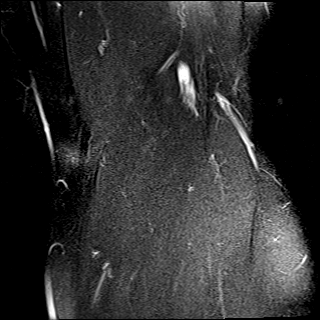

[40 of 40 positions shown; findings below may reference images not displayed]

FINDINGS: MEDIAL MENISCUS:  Intact.
LATERAL MENISCUS:  Truncated appearance of the posterior horn consistent with radial tear. Horizontal tear involving the posterior horn and body, exiting the inferior articular surface.
ACL:  Intact.
PCL:  Intact.
MCL:  Intact.
LATERAL LIGAMENTS AND TENDONS:  Intact.
EXTENSOR MECHANISM:  The quadriceps and patellar tendons are intact.
FAT PADS:   Normal.
CARTILAGE:
Patellofemoral compartment:  Intact.
Medial compartment:  Intact.
Lateral compartment: Intact.
BONE MARROW: Normal.
Tiny effusion.
IMPRESSION: Radial tear of the posterior horn lateral meniscus. Horizontal tear of the posterior horn and body lateral meniscus, exiting the inferior articular surface.

## 2022-05-22 IMAGING — RF ARTHROGRAM HIP RT
2 series · 2 of 2 positions shown · non-contrast
Comparison: None.

Images Obtained from [facility]
PROCEDURE:  Fluoroscopic-guided right hip joint injection prior to MR arthrography.
CLINICAL HISTORY AND INDICATION: Pain in right hip
PRE-PROCEDURE DIAGNOSIS: Arthrogram for MRI.
POST-PROCEDURE DIAGNOSIS: Same.

[Series 2: ap · 0.17mm/px · 1 of 1 slices shown]
[im 1/1]
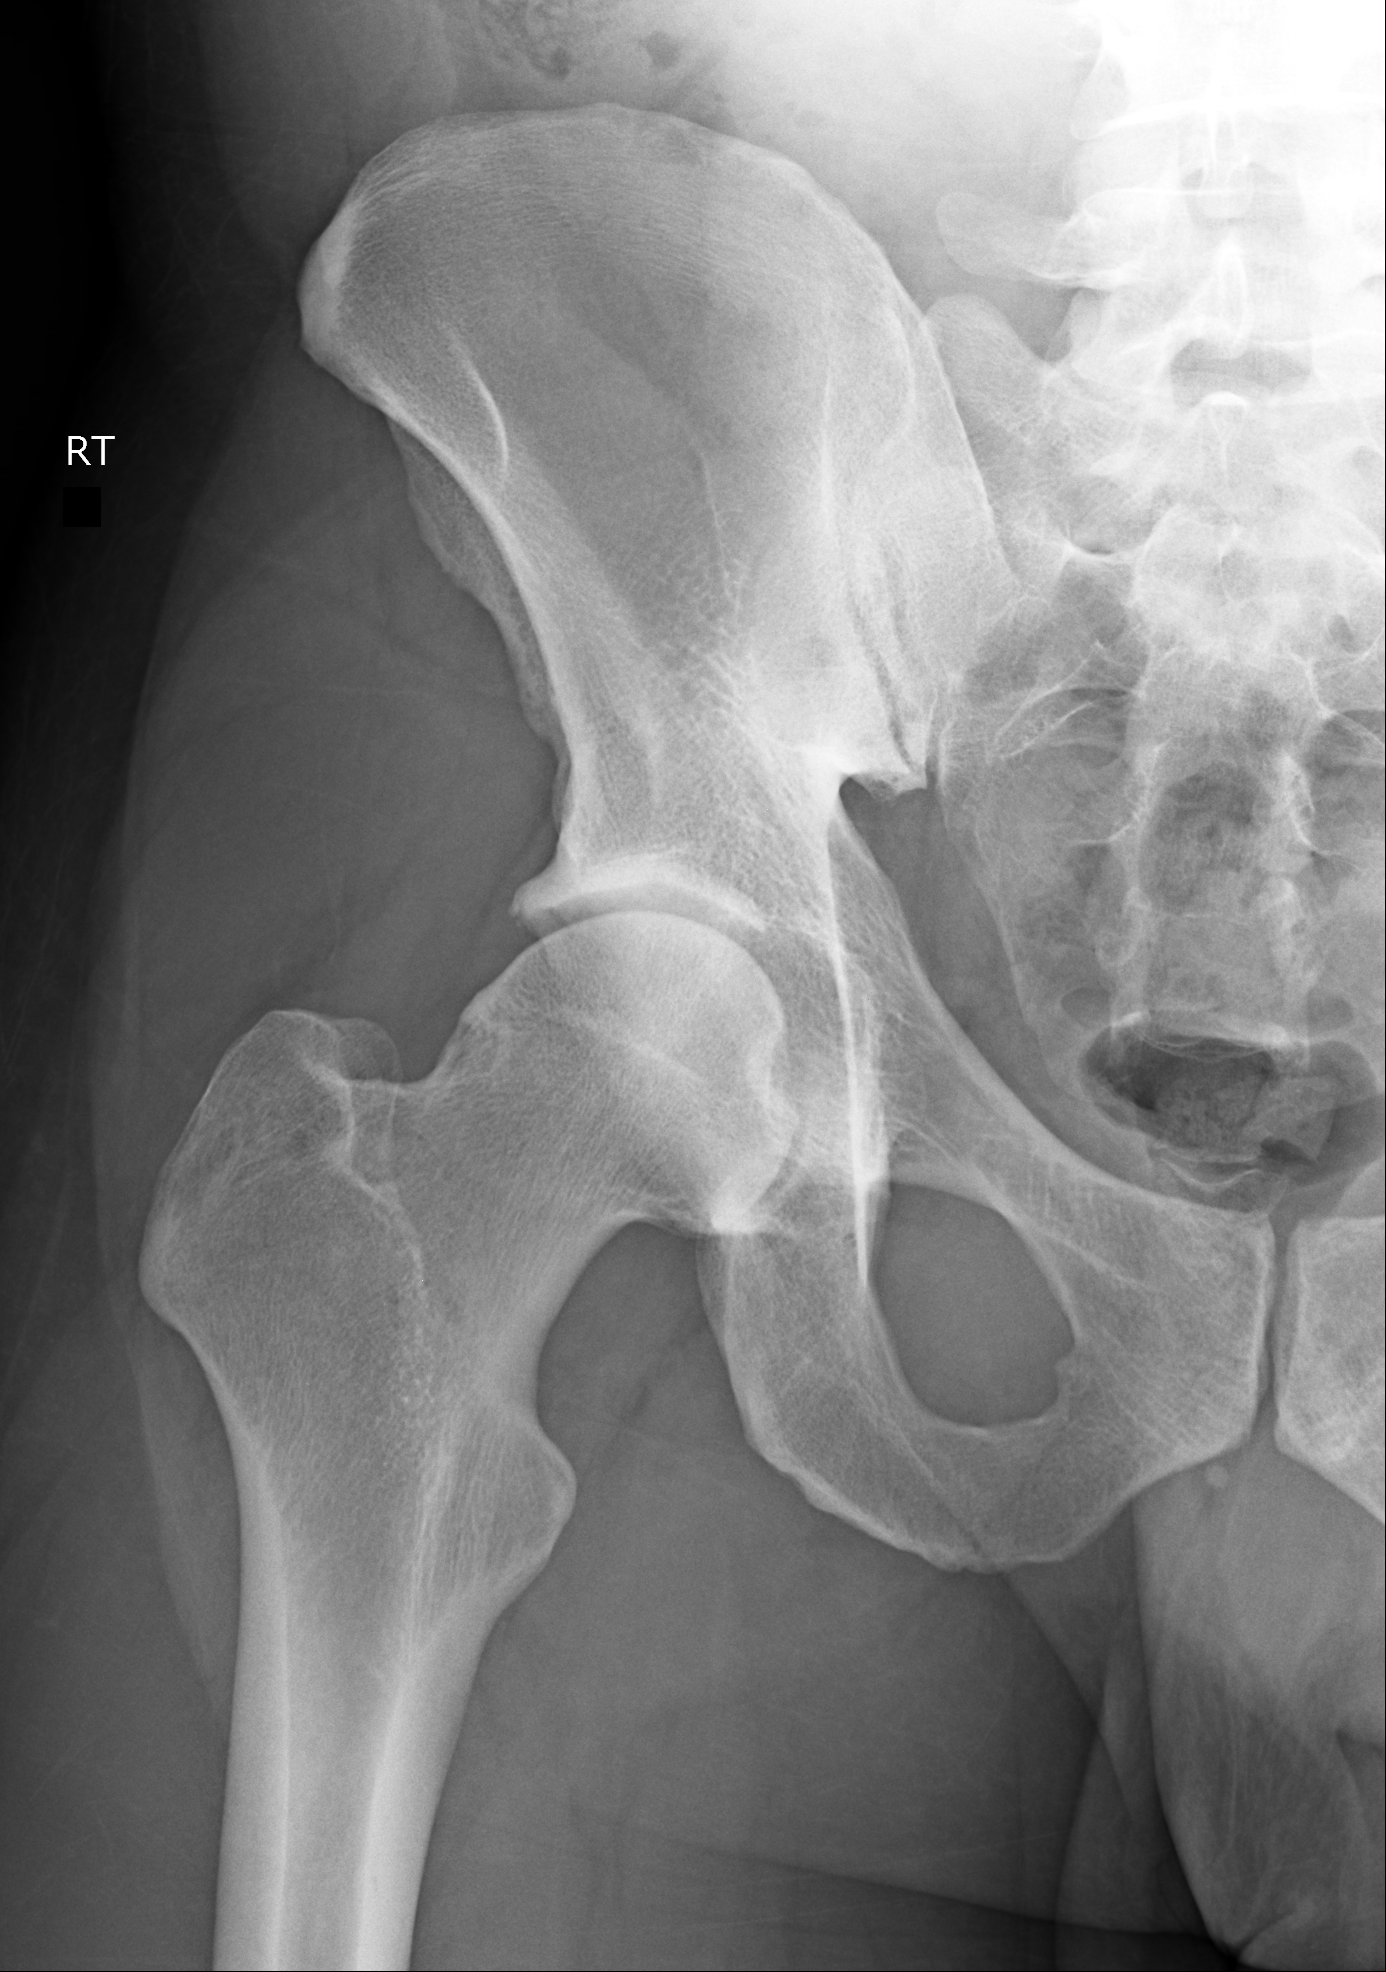

[Series 2: cp_standard · 0.17mm/px · 1 of 1 slices shown]
[im 1/1]
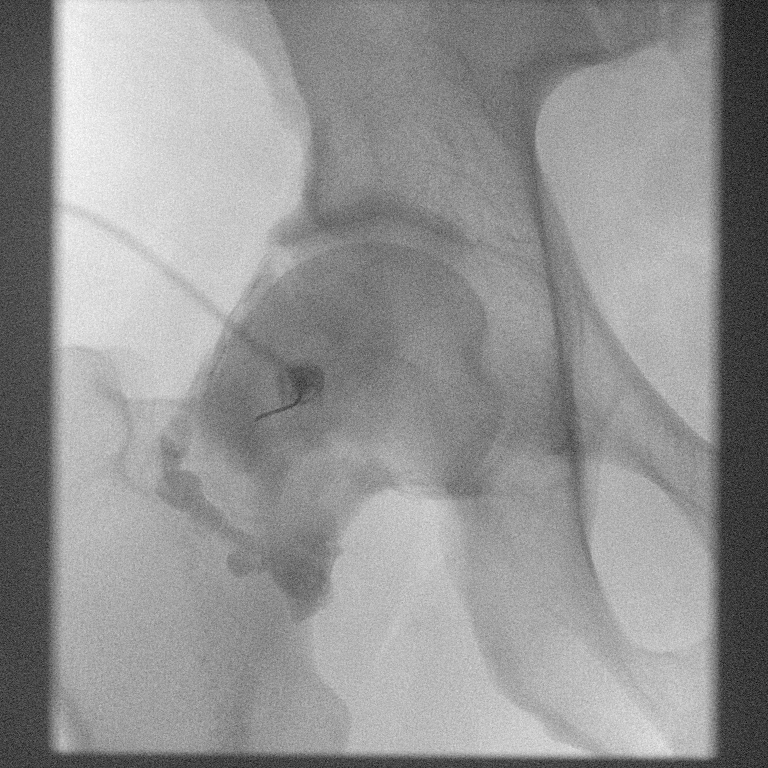

[2 of 2 positions shown; findings below may reference images not displayed]

CONSENT: The risks, benefits and alternatives of arthrography were discussed in detail, including risks of bleeding, infection, pain and allergic reaction.  All questions answered and verbal and
written informed consent was obtained.
TECHNIQUE AND FINDINGS:  The patient was placed supine on the fluoroscopic table.  A "timeout" procedure was performed prior to lidocaine administration. The skin over the right hip was prepped and
draped in standard sterile fashion. Local anesthetic was provided in the skin and subcutaneous tissues with 3 mL of 1% lidocaine.
Using intermittent fluoroscopic guidance, a 22 gauge by 3.5 inch needle was placed into the right hip joint capsule. Placement was confirmed with injection of approximately 2 mL of Gsovue-3FF
iodinated contrast.  Approximately 15 mL of dilute contrast mixture (10 mL normal saline, 5 mL Omnipaque 240 iodinated contrast, 0.1 mL Prohance gadolinium) was then injected into the joint space
under intermittent fluoroscopic visualization.
RADIATION DOSE:
Number of fluoroscopic images:  1
Fluoroscopy time: 0.9 Minutes
Dose area product: 430.4 mGy*m2
Reference air kerma: 3.7 mGy
The needle was removed and hemostasis was achieved. There were no immediate complications.
The patient was transferred to MRI for subsequent imaging.
IMPRESSION: Successful injection of contrast in the right hip for subsequent MR arthrogram imaging, which is dictated on a separate report.

## 2023-02-20 IMAGING — MR MRI HIP LT WO CONTRAST
6 series · 38 of 40 positions shown · non-contrast
Comparison: MRI right hip 05/22/2022

Images Obtained from [facility]
INDICATION: Pain in left hip.
TECHNIQUE: Multiplanar, multiecho imaging of the left hip was performed, including T1-weighted and fluid sensitive sequences without intravenous contrast administration.

[Series 1: labral · axial · left · 6.0mm · 0.78mm/px · z∈[-36,+200]mm · 4 of 12 slices shown]
[im 1/12]
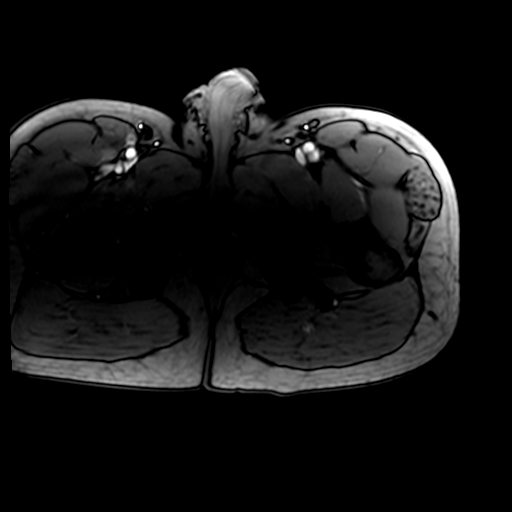
[im 4/12]
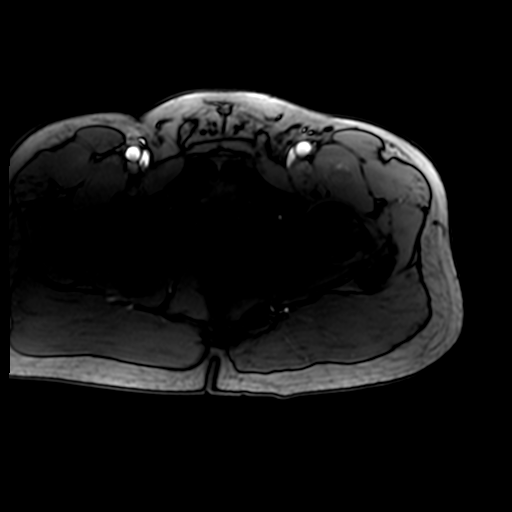
[im 8/12]
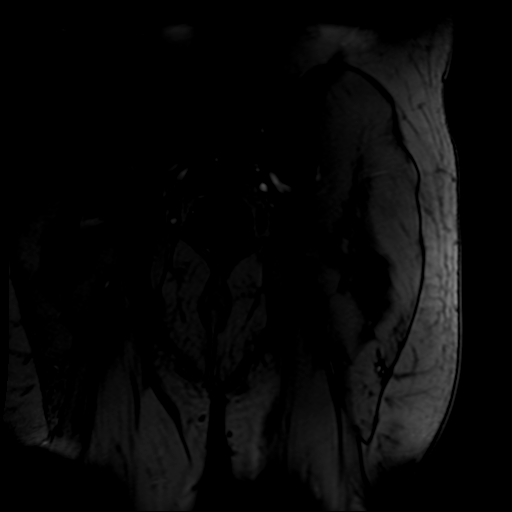
[im 12/12]
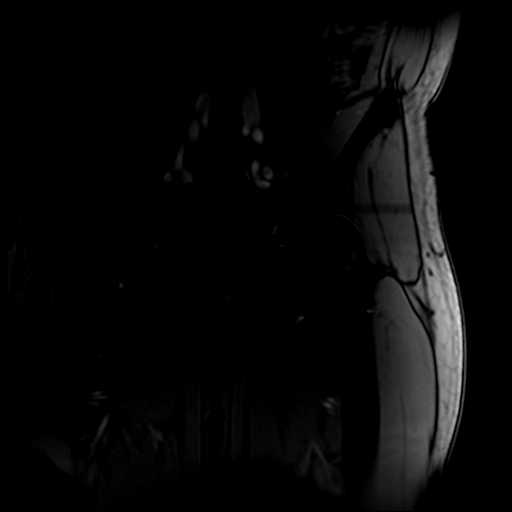

[Series 2: t1_cor_obl · coronal · left · 3.0mm · 0.56mm/px · 7 of 28 slices shown]
[im 1/28]
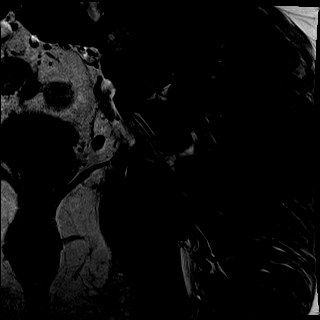
[im 5/28]
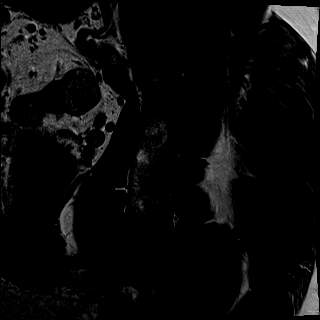
[im 10/28]
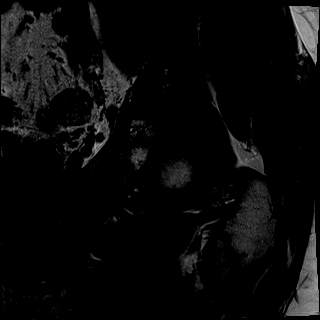
[im 14/28]
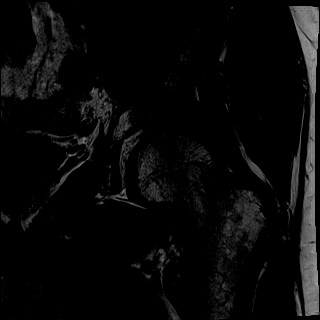
[im 19/28]
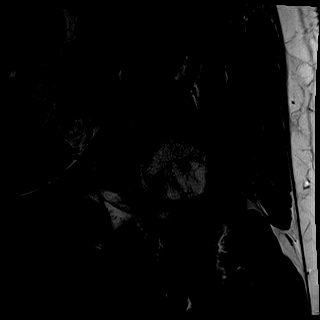
[im 23/28]
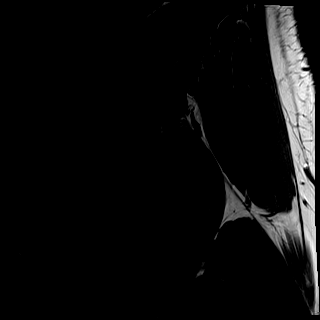
[im 28/28]
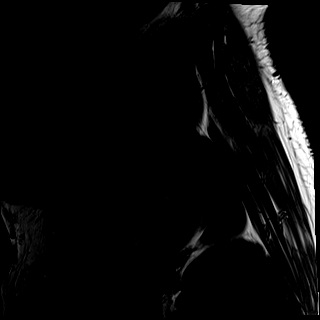

[Series 3: pd_cor_obl_fs · coronal · left · 3.0mm · 0.56mm/px · 7 of 28 slices shown]
[im 1/28]
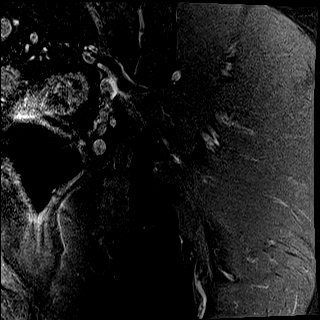
[im 5/28]
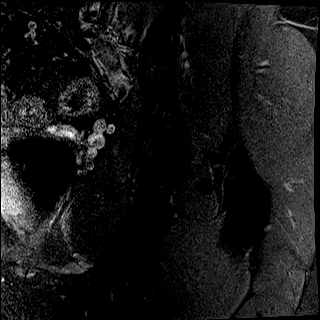
[im 10/28]
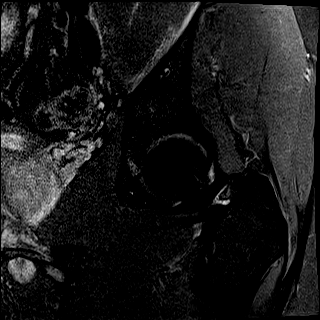
[im 14/28]
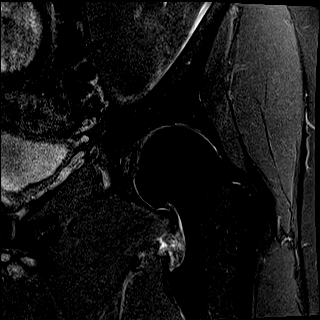
[im 19/28]
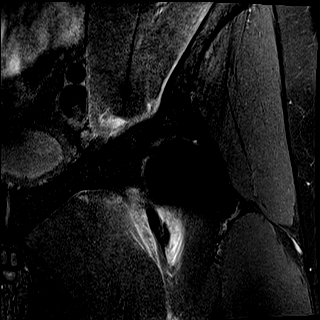
[im 23/28]
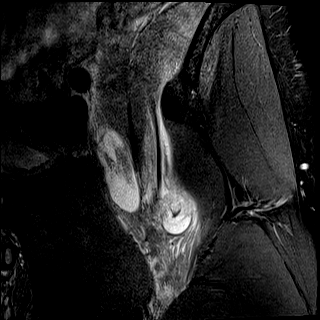
[im 28/28]
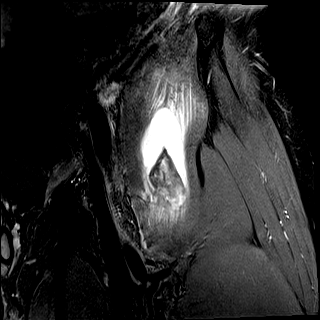

[Series 4: pd_sag_obl_fs · sagittal · left · 3.0mm · 0.56mm/px · 7 of 30 slices shown]
[im 1/30]
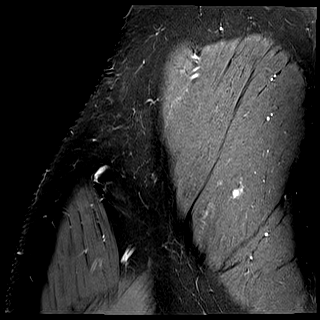
[im 5/30]
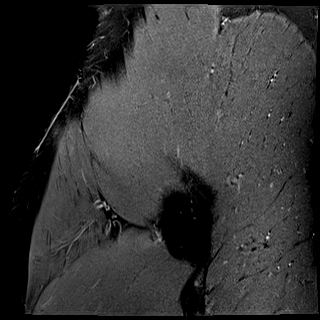
[im 10/30]
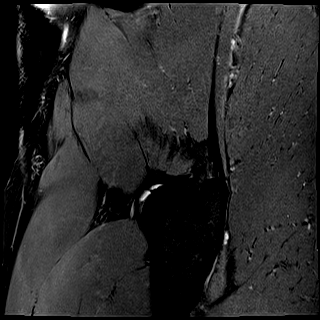
[im 15/30]
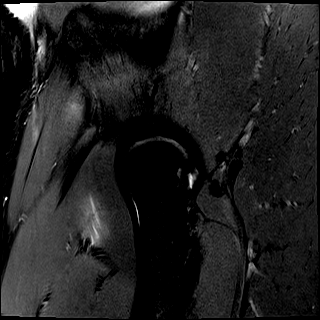
[im 20/30]
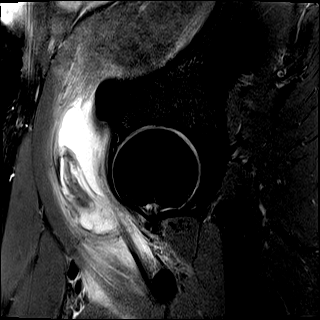
[im 25/30]
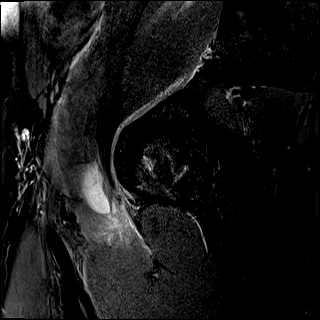
[im 30/30]
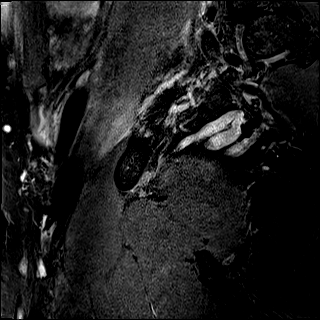

[Series 5: pd_axial_obl_fs · oblique · left · 3.0mm · 0.56mm/px · 6 of 26 slices shown]
[im 1/26]
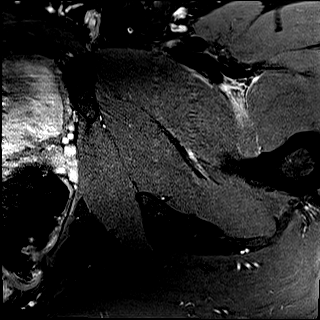
[im 6/26]
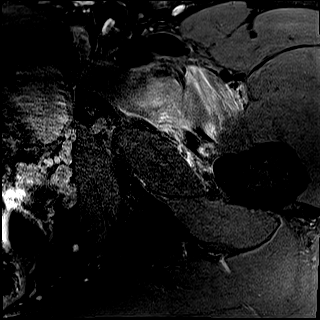
[im 11/26]
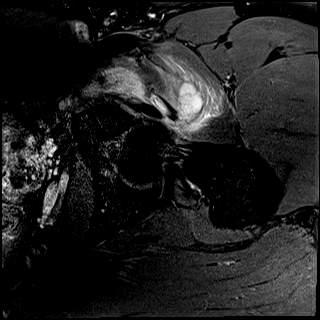
[im 16/26]
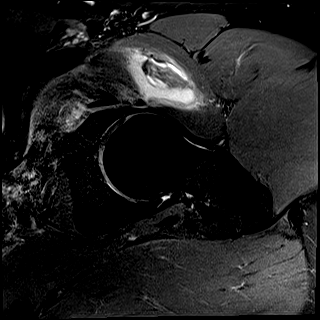
[im 21/26]
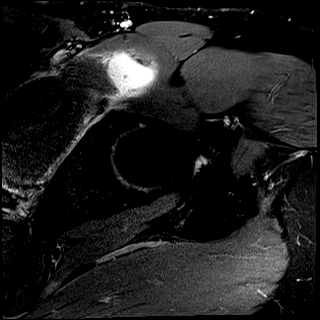
[im 26/26]
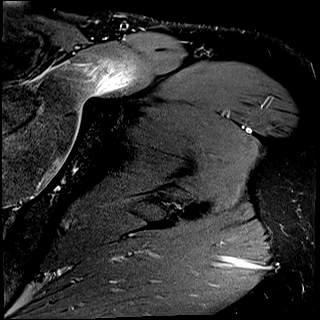

[Series 6: t2_axial_fs · axial · left · 4.0mm · 0.56mm/px · z∈[-73,+87]mm · 7 of 38 slices shown]
[im 1/38]
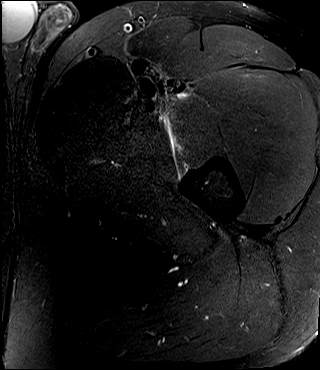
[im 5/38]
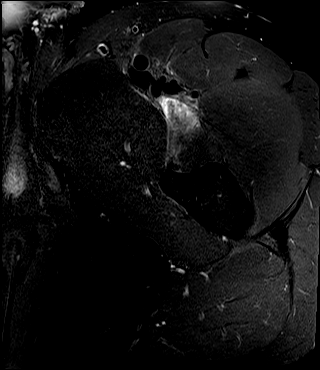
[im 10/38]
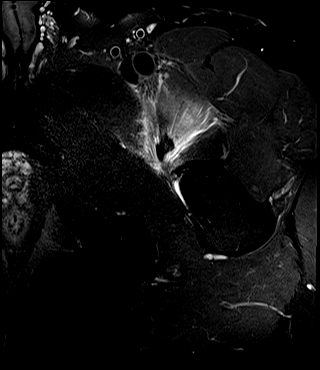
[im 14/38]
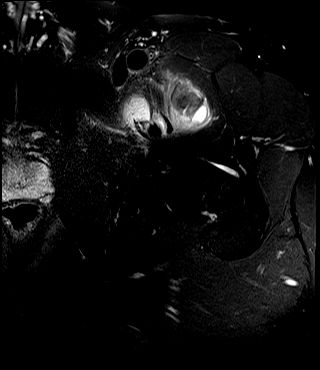
[im 24/38]
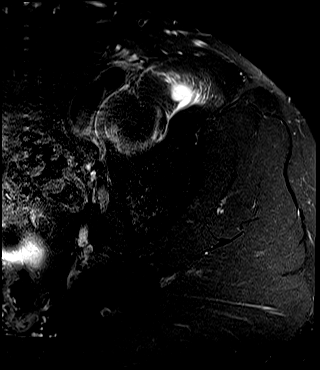
[im 28/38]
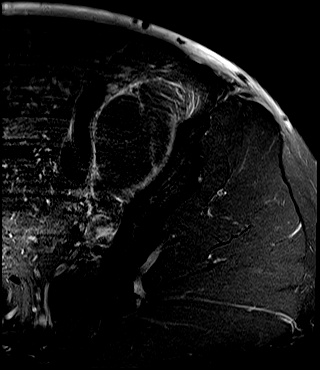
[im 33/38]
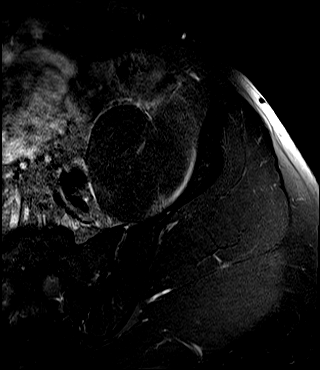

[38 of 40 positions shown; findings below may reference images not displayed]

FINDINGS: Marrow: Normal; no fracture or femoral head AVN.
Visualized SI joints and pubic symphysis: Normal.
Hip joints: Articular surfaces are normally maintained. No effusion. Anterosuperior labral tear. Mild cam morphology
Tendons: The gluteus minimus and medius tendons are intact and unremarkable. No trochanteric bursitis.
Partial thickness myotendinous junction tears involving both the iliacus and psoas portions of the iliopsoas tendon. There are associated fluid collections within the respective muscles distally.
Soft tissue attenuation within the fluid collections likely relates to blood clot. Other etiologies are less likely given the history. However, follow-up postcontrast portion of the study to exclude
any enhancement. No visible flow voids.
Rectus femoris tendon intact and unremarkable
Common hamstring tendon normal
Soft tissues: Remaining muscle signal normal. No adenopathy.
Limited visualization of intrapelvic organs demonstrates nothing unusual on the noncontrast study. No visible free fluid.
IMPRESSION: 1.  Partial-thickness myotendinous junction tears involving both the iliacus and psoas portions of the iliopsoas tendon. There are associated fluid collections within the respective muscles distally.
Soft tissue attenuation within the fluid collections likely relates to blood clot. Other etiologies are less likely given the history. However, follow-up postcontrast portion of the study to exclude
any enhancement.
2.  Anterosuperior labral tear. Mild cam morphology.
